# Patient Record
Sex: Male | Born: 1940 | Hispanic: Yes | Marital: Married | Smoking: Former smoker
Health system: Southern US, Community
[De-identification: ages and names within clinical notes are randomized; demographics above are authoritative.]

## PROBLEM LIST (undated history)

## (undated) DIAGNOSIS — M1990 Unspecified osteoarthritis, unspecified site: Secondary | ICD-10-CM

## (undated) DIAGNOSIS — R739 Hyperglycemia, unspecified: Secondary | ICD-10-CM

## (undated) DIAGNOSIS — C61 Malignant neoplasm of prostate: Secondary | ICD-10-CM

## (undated) DIAGNOSIS — R55 Syncope and collapse: Secondary | ICD-10-CM

## (undated) DIAGNOSIS — Z9889 Other specified postprocedural states: Secondary | ICD-10-CM

## (undated) DIAGNOSIS — E785 Hyperlipidemia, unspecified: Secondary | ICD-10-CM

## (undated) DIAGNOSIS — K219 Gastro-esophageal reflux disease without esophagitis: Secondary | ICD-10-CM

## (undated) DIAGNOSIS — T8859XA Other complications of anesthesia, initial encounter: Secondary | ICD-10-CM

## (undated) DIAGNOSIS — J189 Pneumonia, unspecified organism: Secondary | ICD-10-CM

## (undated) DIAGNOSIS — D689 Coagulation defect, unspecified: Secondary | ICD-10-CM

## (undated) DIAGNOSIS — N410 Acute prostatitis: Secondary | ICD-10-CM

## (undated) DIAGNOSIS — I10 Essential (primary) hypertension: Secondary | ICD-10-CM

## (undated) DIAGNOSIS — R112 Nausea with vomiting, unspecified: Secondary | ICD-10-CM

## (undated) DIAGNOSIS — T4145XA Adverse effect of unspecified anesthetic, initial encounter: Secondary | ICD-10-CM

## (undated) DIAGNOSIS — S129XXA Fracture of neck, unspecified, initial encounter: Secondary | ICD-10-CM

## (undated) HISTORY — DX: Essential (primary) hypertension: I10

## (undated) HISTORY — PX: LIPOMA EXCISION: SHX5283

## (undated) HISTORY — PX: EYE SURGERY: SHX253

## (undated) HISTORY — DX: Acute prostatitis: N41.0

## (undated) HISTORY — DX: Syncope and collapse: R55

## (undated) HISTORY — DX: Hyperglycemia, unspecified: R73.9

## (undated) HISTORY — DX: Hyperlipidemia, unspecified: E78.5

## (undated) HISTORY — DX: Malignant neoplasm of prostate: C61

## (undated) HISTORY — DX: Fracture of neck, unspecified, initial encounter: S12.9XXA

## (undated) HISTORY — PX: KNEE ARTHROSCOPY: SUR90

## (undated) HISTORY — DX: Unspecified osteoarthritis, unspecified site: M19.90

---

## 1977-08-02 HISTORY — PX: INGUINAL HERNIA REPAIR: SUR1180

## 1978-08-02 DIAGNOSIS — S129XXA Fracture of neck, unspecified, initial encounter: Secondary | ICD-10-CM

## 1978-08-02 HISTORY — DX: Fracture of neck, unspecified, initial encounter: S12.9XXA

## 1995-08-03 HISTORY — PX: PROSTATECTOMY: SHX69

## 2005-03-24 ENCOUNTER — Ambulatory Visit: Payer: Self-pay | Admitting: Gastroenterology

## 2005-08-18 ENCOUNTER — Ambulatory Visit: Payer: Self-pay | Admitting: Internal Medicine

## 2005-09-28 ENCOUNTER — Ambulatory Visit: Payer: Self-pay | Admitting: Psychiatry

## 2005-10-07 ENCOUNTER — Ambulatory Visit: Payer: Self-pay | Admitting: Psychiatry

## 2009-03-24 ENCOUNTER — Ambulatory Visit: Payer: Self-pay | Admitting: Internal Medicine

## 2009-04-04 ENCOUNTER — Encounter: Payer: Self-pay | Admitting: Specialist

## 2009-05-02 ENCOUNTER — Encounter: Payer: Self-pay | Admitting: Specialist

## 2009-06-09 ENCOUNTER — Ambulatory Visit: Payer: Self-pay | Admitting: Specialist

## 2009-09-30 ENCOUNTER — Ambulatory Visit: Payer: Self-pay | Admitting: Internal Medicine

## 2009-10-30 ENCOUNTER — Ambulatory Visit: Payer: Self-pay | Admitting: Internal Medicine

## 2010-03-25 ENCOUNTER — Ambulatory Visit: Payer: Self-pay | Admitting: Gastroenterology

## 2010-04-02 ENCOUNTER — Ambulatory Visit: Payer: Self-pay | Admitting: Oncology

## 2010-05-02 ENCOUNTER — Ambulatory Visit: Payer: Self-pay | Admitting: Internal Medicine

## 2010-05-08 ENCOUNTER — Ambulatory Visit: Payer: Self-pay | Admitting: Internal Medicine

## 2010-06-02 ENCOUNTER — Ambulatory Visit: Payer: Self-pay | Admitting: Internal Medicine

## 2011-03-30 ENCOUNTER — Ambulatory Visit (INDEPENDENT_AMBULATORY_CARE_PROVIDER_SITE_OTHER): Payer: Medicare Other | Admitting: Internal Medicine

## 2011-03-30 ENCOUNTER — Encounter: Payer: Self-pay | Admitting: Internal Medicine

## 2011-03-30 DIAGNOSIS — G43909 Migraine, unspecified, not intractable, without status migrainosus: Secondary | ICD-10-CM | POA: Insufficient documentation

## 2011-03-30 DIAGNOSIS — I10 Essential (primary) hypertension: Secondary | ICD-10-CM | POA: Insufficient documentation

## 2011-03-30 DIAGNOSIS — K121 Other forms of stomatitis: Secondary | ICD-10-CM

## 2011-03-30 DIAGNOSIS — R079 Chest pain, unspecified: Secondary | ICD-10-CM

## 2011-03-30 DIAGNOSIS — K1370 Unspecified lesions of oral mucosa: Secondary | ICD-10-CM

## 2011-03-30 LAB — COMPREHENSIVE METABOLIC PANEL
CO2: 33 mEq/L — ABNORMAL HIGH (ref 19–32)
Calcium: 9.3 mg/dL (ref 8.4–10.5)
Chloride: 105 mEq/L (ref 96–112)
Creatinine, Ser: 0.7 mg/dL (ref 0.4–1.5)
GFR: 116.25 mL/min (ref >60.00)
Glucose, Bld: 102 mg/dL — ABNORMAL HIGH (ref 70–99)
Total Bilirubin: 0.5 mg/dL (ref 0.3–1.2)

## 2011-03-30 NOTE — Progress Notes (Signed)
Subjective:    Patient ID: Seth Baxter, male    DOB: 07/06/1941, 70 y.o.   MRN: [id number]  HPI Seth Baxter is a 70 year old male with a history of hypertension who presents for followup. He brings a record of his blood pressure today. His typical blood pressure ranges from 120-150/70-90. He does have one outlier of 177/90. This occurred when he forgot to take his medication. He continues to be extremely active working out at [employer] by running 5 miles most days of the week. He has had some intermittent episodes of sharp chest pain. These episodes do not occur when he is exercising. They occur at rest. They're described as a sharp pain that extends through his mid chest. They last only seconds and resolved without intervention. He denies any shortness of breath, diaphoresis, nausea, palpitations, or other symptoms.  Seth Baxter also reports recurrent episodes of oral ulcers. He reports these ulcers are painful. They occur both on the back of his mouth and on his hard palate. He has not been able to identify a trigger such as spicy foods that might lead to ulcers. He denies any recent increase in stress. He has been using Ambusol for the ulcers with moderate improvement.    Outpatient Encounter Prescriptions as of 03/30/2011  Medication Sig Dispense Refill  . Eszopiclone (ESZOPICLONE) 3 MG TABS Take 3 mg by mouth at bedtime. Take immediately before bedtime       . meloxicam (MOBIC) 15 MG tablet Take 15 mg by mouth daily.        . metoprolol succinate (TOPROL-XL) 25 MG 24 hr tablet Take 25 mg by mouth daily.        . quinapril (ACCUPRIL) 10 MG tablet Take 10 mg by mouth at bedtime.        . rosuvastatin (CRESTOR) 5 MG tablet Take 5 mg by mouth daily.           Review of Systems  Constitutional: Negative for fever, chills, activity change, appetite change, fatigue and unexpected weight change.  HENT: Positive for mouth sores. Negative for congestion, rhinorrhea and neck pain.   Eyes:  Negative for visual disturbance.  Respiratory: Negative for cough, chest tightness, shortness of breath and wheezing.   Cardiovascular: Positive for chest pain. Negative for palpitations and leg swelling.  Gastrointestinal: Negative for abdominal pain, diarrhea, constipation and abdominal distention.  Genitourinary: Negative for dysuria, urgency and difficulty urinating.  Musculoskeletal: Positive for arthralgias (chronic knee pain). Negative for myalgias, joint swelling and gait problem.  Skin: Negative for color change and rash.  Hematological: Negative for adenopathy.  Psychiatric/Behavioral: Negative for sleep disturbance and dysphoric mood. The patient is not nervous/anxious.    BP 147/67  Pulse 52  Temp(Src) 98 F (36.7 C) (Oral)  Resp 12  Ht 5\' 3"  (1.6 m)  Wt 132 lb (59.875 kg)  BMI 23.38 kg/m2  SpO2 96%     Objective:   Physical Exam  Constitutional: He is oriented to person, place, and time. He appears well-developed and well-nourished. No distress.  HENT:  Head: Normocephalic and atraumatic.  Right Ear: External ear normal.  Left Ear: External ear normal.  Nose: Nose normal.  Mouth/Throat: Oropharynx is clear and moist. No oropharyngeal exudate.       Few pustular lesions and ulcers bilateral tonsillar pillars and anterior hard palate  Eyes: Conjunctivae and EOM are normal. Pupils are equal, round, and reactive to light. Right eye exhibits no discharge. Left eye exhibits no discharge. No scleral icterus.  Neck: Normal range of motion. Neck supple. No tracheal deviation present. No thyromegaly present.  Cardiovascular: Regular rhythm, S1 normal, S2 normal, normal heart sounds and normal pulses.  Bradycardia present.  PMI is not displaced.  Exam reveals no gallop and no friction rub.   No murmur heard. Pulmonary/Chest: Effort normal and breath sounds normal. No respiratory distress. He has no wheezes. He has no rales. He exhibits no tenderness, no edema, no deformity and no  swelling.  Abdominal: Soft. Bowel sounds are normal. He exhibits no distension and no mass. There is no tenderness. There is no rebound and no guarding.  Musculoskeletal: Normal range of motion. He exhibits no edema.  Lymphadenopathy:    He has no cervical adenopathy.  Neurological: He is alert and oriented to person, place, and time. No cranial nerve deficit. Coordination normal.  Skin: Skin is warm and dry. No rash noted. He is not diaphoretic. No erythema. No pallor.  Psychiatric: He has a normal mood and affect. His behavior is normal. Judgment and thought content normal.          Assessment & Plan:  1. Chest pain -Seth Baxter reports intermittent episodes of fleeting sharp chest pain over the last several weeks. Given that this pain does not occur with exertion and he is able to run 5 miles per day, my suspicion of cardiac etiology is low. We will however perform an EKG today. Pulmonary etiology seems unlikely as no cough, dyspnea or other complaints. Will however get a baseline chest x-ray. Question if this pain may be musculoskeletal in nature.  He does lift weights on a regular basis. Alternative etiologies would include gastroesophageal reflux. Should his EKG and chest x-ray be normal and lab work be normal will give a trial of PPI to see if any improvement. He will return to clinic in 4 weeks. We will call him with results of tests.  2.Hypertension - Patient with a long-standing history of hypertension, typically well-controlled. Single recent elevated blood pressure in the setting of failure to take medication. For now will continue current medications. Will check renal function with labs today.   3. 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 Appearance on exam today is not consistent with simple aphthous ulcer. Ulcerated areas on tonsillar pillars appear more pustular in nature. Given the persistence of these lesions, and history of tobacco  exposure, I think they warrant a biopsy. Will set up with ENT physician.

## 2011-03-30 NOTE — Patient Instructions (Signed)
Bring list of medications. Email with blood pressure. Return to clinic in 1 month.

## 2011-04-01 ENCOUNTER — Ambulatory Visit: Payer: Self-pay | Admitting: Internal Medicine

## 2011-04-12 ENCOUNTER — Ambulatory Visit: Payer: Self-pay | Admitting: Specialist

## 2011-04-14 ENCOUNTER — Encounter: Payer: Self-pay | Admitting: Internal Medicine

## 2011-05-03 ENCOUNTER — Ambulatory Visit (INDEPENDENT_AMBULATORY_CARE_PROVIDER_SITE_OTHER): Payer: Medicare Other | Admitting: Internal Medicine

## 2011-05-03 ENCOUNTER — Encounter: Payer: Self-pay | Admitting: Internal Medicine

## 2011-05-03 DIAGNOSIS — M25569 Pain in unspecified knee: Secondary | ICD-10-CM

## 2011-05-03 DIAGNOSIS — R079 Chest pain, unspecified: Secondary | ICD-10-CM

## 2011-05-03 DIAGNOSIS — M25561 Pain in right knee: Secondary | ICD-10-CM

## 2011-05-03 DIAGNOSIS — I10 Essential (primary) hypertension: Secondary | ICD-10-CM

## 2011-05-03 DIAGNOSIS — G43909 Migraine, unspecified, not intractable, without status migrainosus: Secondary | ICD-10-CM

## 2011-05-03 NOTE — Progress Notes (Signed)
Subjective:    Patient ID: Seth Baxter, male    DOB: 1940-10-23, 70 y.o.   MRN: [id number]  HPI Seth Baxter is a 70 year old male with a history of hypertension who presents for followup. At his last visit he complained of intermittent chest pain which was nonexertional in nature. He had an EKG and chest x-ray which were both normal. He also had lab work including electrolytes and renal function which were normal. He continues to exercise on an almost daily basis without chest pain. He notices chest pain most with movement of his arms. The pain is described as minimal and he does not take medications for it.  He has been checking his blood pressure and it typically runs 130 to 150/80-90. He notes that his blood pressure is elevated this morning and attributes this to a headache. He has chronic migraines for which he takes butalbital.  He also notes that he was recently seen by orthopedics and had an MRI of his right knee because of persistent knee pain. He reports that he was diagnosed with a meniscal tear. He has had a previous repair of a torn meniscus in the same knee. He reports that his orthopedic surgeon injected cortisone and is planning to follow him symptomatically.  Outpatient Encounter Prescriptions as of 05/03/2011  Medication Sig Dispense Refill  . acetaminophen (TYLENOL ARTHRITIS PAIN) 650 MG CR tablet Take 650 mg by mouth 3 (three) times daily.        Marland Kitchen amLODipine (NORVASC) 10 MG tablet Take 10 mg by mouth daily.        . butalbital-aspirin-caffeine (FIORINAL) 50-325-40 MG per capsule Take 1 capsule by mouth as needed.        . chlorpheniramine (CHLOR-TRIMETON) 4 MG tablet Take 4 mg by mouth as needed.        . cholecalciferol (VITAMIN D) 1000 UNITS tablet Take 1,000 Units by mouth daily.        . Eszopiclone (ESZOPICLONE) 3 MG TABS Take 3 mg by mouth at bedtime. Take immediately before bedtime       . fish oil-omega-3 fatty acids 1000 MG capsule Take 1 g by mouth 3 (three) times  daily.        . Garlic 1000 MG CAPS Take 1 capsule by mouth 2 (two) times daily.        . meloxicam (MOBIC) 15 MG tablet Take 15 mg by mouth daily.        . metoprolol succinate (TOPROL-XL) 25 MG 24 hr tablet Take 25 mg by mouth daily.        . Pantothenic Acid 500 MG TABS Take 1 tablet by mouth daily.        . quinapril (ACCUPRIL) 10 MG tablet Take 10 mg by mouth at bedtime.        . rosuvastatin (CRESTOR) 5 MG tablet Take 5 mg by mouth daily.        . traMADol (ULTRAM) 50 MG tablet Take 50 mg by mouth as needed.          Review of Systems  Constitutional: Negative for fever, chills, activity change, appetite change, fatigue and unexpected weight change.  Eyes: Negative for visual disturbance.  Respiratory: Negative for cough and shortness of breath.   Cardiovascular: Positive for chest pain. Negative for palpitations and leg swelling.  Gastrointestinal: Negative for abdominal pain and abdominal distention.  Genitourinary: Negative for dysuria, urgency and difficulty urinating.  Musculoskeletal: Positive for myalgias and arthralgias. Negative for gait problem.  Skin:  Negative for color change and rash.  Hematological: Negative for adenopathy.  Psychiatric/Behavioral: Negative for sleep disturbance and dysphoric mood. The patient is not nervous/anxious.    BP 159/79  Pulse 55  Temp(Src) 98.4 F (36.9 C) (Oral)  Resp 16  Ht 5\' 3"  (1.6 m)  Wt 133 lb 4 oz (60.442 kg)  BMI 23.60 kg/m2  SpO2 97%      Objective:   Physical Exam  Constitutional: He is oriented to person, place, and time. He appears well-developed and well-nourished. No distress.  HENT:  Head: Normocephalic and atraumatic.  Right Ear: External ear normal.  Left Ear: External ear normal.  Nose: Nose normal.  Mouth/Throat: Oropharynx is clear and moist. No oropharyngeal exudate.  Eyes: Conjunctivae and EOM are normal. Pupils are equal, round, and reactive to light. Right eye exhibits no discharge. Left eye exhibits  no discharge. No scleral icterus.  Neck: Normal range of motion. Neck supple. No tracheal deviation present. No thyromegaly present.  Cardiovascular: Normal rate, regular rhythm and normal heart sounds.  Exam reveals no gallop and no friction rub.   No murmur heard. Pulmonary/Chest: Effort normal and breath sounds normal. No respiratory distress. He has no wheezes. He has no rales. He exhibits no tenderness.  Musculoskeletal: Normal range of motion. He exhibits no edema.  Lymphadenopathy:    He has no cervical adenopathy.  Neurological: He is alert and oriented to person, place, and time. No cranial nerve deficit. Coordination normal.  Skin: Skin is warm and dry. No rash noted. He is not diaphoretic. No erythema. No pallor.  Psychiatric: He has a normal mood and affect. His behavior is normal. Judgment and thought content normal.          Assessment & Plan:  1. Hypertension - blood pressure is slightly elevated today. However, he notes headache. He will plan to monitor blood pressure at home and will e-mail me with blood pressure readings. His blood pressure persistently elevated greater than 140/90 will consider increasing his dose of quinapril. Otherwise, he will followup in 3 months.  2. Headaches - patient with chronic migraine headaches for which he takes butalbital. He reports good relief with butalbital. We'll plan to continue this medication.  3. Right Knee pain -patient with right knee meniscal tear. He reports recent steroid injection from orthopedics. We'll plan to follow symptomatically.  4. Chest pain - EKG and CXR normal.  Pain is described as mild and does not occur with exertion.  Suspect costochondritis secondary to using ellipitical.  Will continue to monitor.

## 2011-05-03 NOTE — Patient Instructions (Signed)
Email with blood pressure readings.

## 2011-05-24 ENCOUNTER — Encounter: Payer: Self-pay | Admitting: Internal Medicine

## 2011-05-28 ENCOUNTER — Other Ambulatory Visit: Payer: Self-pay | Admitting: Internal Medicine

## 2011-05-28 DIAGNOSIS — G4700 Insomnia, unspecified: Secondary | ICD-10-CM

## 2011-05-28 MED ORDER — ESZOPICLONE 3 MG PO TABS: 3.0000 mg | ORAL_TABLET | Freq: Every day | ORAL | Status: DC

## 2011-07-11 ENCOUNTER — Other Ambulatory Visit: Payer: Self-pay | Admitting: Internal Medicine

## 2011-08-04 ENCOUNTER — Ambulatory Visit: Payer: Medicare Other | Admitting: Internal Medicine

## 2011-08-07 ENCOUNTER — Encounter: Payer: Self-pay | Admitting: *Deleted

## 2011-08-07 ENCOUNTER — Encounter: Payer: Self-pay | Admitting: Internal Medicine

## 2011-08-11 ENCOUNTER — Ambulatory Visit: Payer: Medicare Other | Admitting: Internal Medicine

## 2011-08-13 ENCOUNTER — Other Ambulatory Visit: Payer: Self-pay | Admitting: Internal Medicine

## 2011-08-24 ENCOUNTER — Ambulatory Visit (INDEPENDENT_AMBULATORY_CARE_PROVIDER_SITE_OTHER): Payer: Medicare Other | Admitting: Internal Medicine

## 2011-08-24 ENCOUNTER — Encounter: Payer: Self-pay | Admitting: Internal Medicine

## 2011-08-24 VITALS — BP 122/70 | HR 52 | Temp 98.3°F | Ht 64.0 in | Wt 126.0 lb

## 2011-08-24 DIAGNOSIS — I10 Essential (primary) hypertension: Secondary | ICD-10-CM | POA: Diagnosis not present

## 2011-08-24 DIAGNOSIS — G4700 Insomnia, unspecified: Secondary | ICD-10-CM

## 2011-08-24 DIAGNOSIS — R1011 Right upper quadrant pain: Secondary | ICD-10-CM

## 2011-08-24 DIAGNOSIS — R519 Headache, unspecified: Secondary | ICD-10-CM

## 2011-08-24 DIAGNOSIS — N529 Male erectile dysfunction, unspecified: Secondary | ICD-10-CM | POA: Diagnosis not present

## 2011-08-24 DIAGNOSIS — R110 Nausea: Secondary | ICD-10-CM | POA: Insufficient documentation

## 2011-08-24 LAB — COMPREHENSIVE METABOLIC PANEL
ALT: 21 U/L (ref 0–53)
AST: 18 U/L (ref 0–37)
Albumin: 4.1 g/dL (ref 3.5–5.2)
Calcium: 9.4 mg/dL (ref 8.4–10.5)
Chloride: 103 mEq/L (ref 96–112)
Creatinine, Ser: 0.7 mg/dL (ref 0.4–1.5)
Potassium: 3.6 mEq/L (ref 3.5–5.1)
Sodium: 141 mEq/L (ref 135–145)

## 2011-08-24 LAB — CBC WITH DIFFERENTIAL/PLATELET
Basophils Relative: 0.4 % (ref 0.0–3.0)
Eosinophils Absolute: 0.2 10*3/uL (ref 0.0–0.7)
MCHC: 34.3 g/dL (ref 30.0–36.0)
MCV: 97.6 fl (ref 78.0–100.0)
Monocytes Absolute: 0.4 10*3/uL (ref 0.1–1.0)
Neutrophils Relative %: 72.4 % (ref 43.0–77.0)
RBC: 4.36 Mil/uL (ref 4.22–5.81)

## 2011-08-24 MED ORDER — ESZOPICLONE 3 MG PO TABS: 3.0000 mg | ORAL_TABLET | Freq: Every day | ORAL | Status: DC

## 2011-08-24 MED ORDER — BUTALBITAL-ASPIRIN-CAFFEINE 50-325-40 MG PO CAPS: 1.0000 | ORAL_CAPSULE | Freq: Two times a day (BID) | ORAL | Status: DC | PRN

## 2011-08-24 NOTE — Assessment & Plan Note (Signed)
Blood pressures are recently well controlled on current medications. We'll plan to continue. Patient will have renal function checked today. We'll plan to followup his blood pressure in 6 months.

## 2011-08-24 NOTE — Assessment & Plan Note (Signed)
He will try holding dose of metoprolol day prior to planned intercourse. He will use Cialis 20 mg as needed. He will call if no improvement in his symptoms.

## 2011-08-24 NOTE — Progress Notes (Signed)
Subjective:    Patient ID: Seth Baxter, male    DOB: July 08, 1941, 71 y.o.   MRN: [id number]  HPI 71 year old male with a history of hypertension presents for followup. In regards to his hypertension, he brings record of his blood pressures today which show most blood pressures between 1:30 and 140/70-80. This is an improvement compared to previous. He notes he has been following a low glycemic index diet and has been exercising on a regular basis. He reports full compliance with his medications. He denies any problems with his medications except for some difficulty maintaining erection with the use of metoprolol. He has used Cialis in the past to help with erectile dysfunction, however recently he has not been as effective. He would like to intermittently hold the dose of metoprolol to see if there is any improvement in his sexual function.  He is also concerned today about a 2 week history of intermittent nausea. He reports the nausea occurs after eating or even with smelling food. He initially had some episodes of vomiting but this has subsided. He has also intermittently had some right upper quadrant abdominal pain. He has not taken any medication for nausea. He has not had any diarrhea. He has not had any change in his bowel habits or blood in his stool.  Outpatient Encounter Prescriptions as of 08/24/2011  Medication Sig Dispense Refill  . Calcium Carbonate-Vitamin D (CALTRATE 600+D) 600-400 MG-UNIT per tablet Take 1 tablet by mouth daily.      Marland Kitchen acetaminophen (TYLENOL ARTHRITIS PAIN) 650 MG CR tablet Take 650 mg by mouth 3 (three) times daily.        Marland Kitchen amLODipine (NORVASC) 10 MG tablet TAKE 1 TABLET DAILY  90 tablet  2  . butalbital-aspirin-caffeine (FIORINAL) 50-325-40 MG per capsule Take 1 capsule by mouth 2 (two) times daily as needed.  60 capsule  3  . chlorpheniramine (CHLOR-TRIMETON) 4 MG tablet Take 4 mg by mouth as needed.        . Eszopiclone (ESZOPICLONE) 3 MG TABS Take 1 tablet (3 mg  total) by mouth at bedtime. Take immediately before bedtime  90 tablet  1  . meloxicam (MOBIC) 15 MG tablet Take 15 mg by mouth daily.        . metoprolol succinate (TOPROL-XL) 25 MG 24 hr tablet Take 25 mg by mouth daily.        . Pantothenic Acid 500 MG TABS Take 1 tablet by mouth daily.        . quinapril (ACCUPRIL) 40 MG tablet TAKE 1 TABLET DAILY  90 tablet  2  . rosuvastatin (CRESTOR) 5 MG tablet Take 5 mg by mouth daily.        . traMADol (ULTRAM) 50 MG tablet Take 50 mg by mouth as needed.          Review of Systems  Constitutional: Negative for fever, chills, activity change, appetite change, fatigue and unexpected weight change.  Eyes: Negative for visual disturbance.  Respiratory: Negative for cough and shortness of breath.   Cardiovascular: Negative for chest pain, palpitations and leg swelling.  Gastrointestinal: Positive for nausea, vomiting and abdominal pain (intermittent RUQ). Negative for diarrhea, constipation, blood in stool, abdominal distention, anal bleeding and rectal pain.  Genitourinary: Negative for dysuria, urgency and difficulty urinating.  Musculoskeletal: Negative for arthralgias and gait problem.  Skin: Negative for color change and rash.  Hematological: Negative for adenopathy.  Psychiatric/Behavioral: Negative for sleep disturbance and dysphoric mood. The patient is not nervous/anxious.  BP 122/70  Pulse 52  Temp(Src) 98.3 F (36.8 C) (Oral)  Ht 5\' 4"  (1.626 m)  Wt 126 lb (57.153 kg)  BMI 21.63 kg/m2  SpO2 98%     Objective:   Physical Exam  Constitutional: He is oriented to person, place, and time. He appears well-developed and well-nourished. No distress.  HENT:  Head: Normocephalic and atraumatic.  Right Ear: External ear normal.  Left Ear: External ear normal.  Nose: Nose normal.  Mouth/Throat: Oropharynx is clear and moist. No oropharyngeal exudate.  Eyes: Conjunctivae and EOM are normal. Pupils are equal, round, and reactive to light.  Right eye exhibits no discharge. Left eye exhibits no discharge. No scleral icterus.  Neck: Normal range of motion. Neck supple. No tracheal deviation present. No thyromegaly present.  Cardiovascular: Normal rate, regular rhythm and normal heart sounds.  Exam reveals no gallop and no friction rub.   No murmur heard. Pulmonary/Chest: Effort normal and breath sounds normal. No respiratory distress. He has no wheezes. He has no rales. He exhibits no tenderness.  Abdominal: Soft. Bowel sounds are normal. He exhibits no distension and no mass. There is no tenderness. There is no rebound and no guarding.  Musculoskeletal: Normal range of motion. He exhibits no edema.  Lymphadenopathy:    He has no cervical adenopathy.  Neurological: He is alert and oriented to person, place, and time. No cranial nerve deficit. Coordination normal.  Skin: Skin is warm and dry. No rash noted. He is not diaphoretic. No erythema. No pallor.  Psychiatric: He has a normal mood and affect. His behavior is normal. Judgment and thought content normal.          Assessment & Plan:

## 2011-08-24 NOTE — Assessment & Plan Note (Signed)
Symptoms are most concerning for cholecystitis. Will check CMP with labs today will get abdominal ultrasound for evaluation of the gallbladder and pancreas. We'll also check for H. pylori infection. Patient will followup in 2 weeks. If initial workup is unrevealing, would prefer to set him up for endoscopy.

## 2011-08-25 ENCOUNTER — Ambulatory Visit: Payer: Self-pay | Admitting: Internal Medicine

## 2011-08-25 DIAGNOSIS — R109 Unspecified abdominal pain: Secondary | ICD-10-CM | POA: Diagnosis not present

## 2011-08-25 DIAGNOSIS — R110 Nausea: Secondary | ICD-10-CM | POA: Diagnosis not present

## 2011-08-27 ENCOUNTER — Telehealth: Payer: Self-pay | Admitting: Internal Medicine

## 2011-08-27 NOTE — Telephone Encounter (Signed)
See My Chart Message.

## 2011-08-29 ENCOUNTER — Encounter: Payer: Self-pay | Admitting: Internal Medicine

## 2011-09-07 ENCOUNTER — Encounter: Payer: Self-pay | Admitting: Internal Medicine

## 2011-09-07 ENCOUNTER — Ambulatory Visit (INDEPENDENT_AMBULATORY_CARE_PROVIDER_SITE_OTHER): Payer: Medicare Other | Admitting: Internal Medicine

## 2011-09-07 DIAGNOSIS — I10 Essential (primary) hypertension: Secondary | ICD-10-CM

## 2011-09-07 DIAGNOSIS — K120 Recurrent oral aphthae: Secondary | ICD-10-CM | POA: Insufficient documentation

## 2011-09-07 DIAGNOSIS — R110 Nausea: Secondary | ICD-10-CM | POA: Diagnosis not present

## 2011-09-07 NOTE — Assessment & Plan Note (Signed)
Chronic. We'll continue to use Kenalog cream as needed. Patient has been evaluated by ENT in the past.

## 2011-09-07 NOTE — Progress Notes (Signed)
Subjective:    Patient ID: Seth Baxter, male    DOB: 22-Aug-1940, 71 y.o.   MRN: [id number]  HPI 71 year old male with a recent history of nausea and abdominal pain presents for followup. He reports that his symptoms of nausea and abdominal pain have resolved. He is able to tolerate a full diet. He denies any vomiting or diarrhea. Recent ultrasound of the abdomen was normal. Lab work including liver function test and testing for H. pylori were also normal.  His only concern today is a several day history of right-sided sore throat. He has a history of oral ulcers in the past for which she has used Kenalog. He notes several new oral ulcers he questions whether he may have an ulcer on the right side of his throat. He denies any nasal congestion, difficulty swallowing, fever, chills, cough, or other symptoms.  In regards to his hypertension, he brings records today of his recent blood pressures which have been running mostly 130/80. He has decreased his metoprolol dose to once daily.  Outpatient Encounter Prescriptions as of 09/07/2011  Medication Sig Dispense Refill  . acetaminophen (TYLENOL ARTHRITIS PAIN) 650 MG CR tablet Take 650 mg by mouth 3 (three) times daily.        Marland Kitchen amLODipine (NORVASC) 10 MG tablet TAKE 1 TABLET DAILY  90 tablet  2  . butalbital-aspirin-caffeine (FIORINAL) 50-325-40 MG per capsule Take 1 capsule by mouth 2 (two) times daily as needed.  60 capsule  3  . Calcium Carbonate-Vitamin D (CALTRATE 600+D) 600-400 MG-UNIT per tablet Take 1 tablet by mouth daily.      . chlorpheniramine (CHLOR-TRIMETON) 4 MG tablet Take 4 mg by mouth as needed.        . Eszopiclone (ESZOPICLONE) 3 MG TABS Take 1 tablet (3 mg total) by mouth at bedtime. Take immediately before bedtime  90 tablet  1  . meloxicam (MOBIC) 15 MG tablet Take 15 mg by mouth daily.        . metoprolol succinate (TOPROL-XL) 25 MG 24 hr tablet Take 25 mg by mouth daily.        . Pantothenic Acid 500 MG TABS Take 1 tablet by  mouth daily.        . quinapril (ACCUPRIL) 40 MG tablet TAKE 1 TABLET DAILY  90 tablet  2  . rosuvastatin (CRESTOR) 5 MG tablet Take 5 mg by mouth daily.        . traMADol (ULTRAM) 50 MG tablet Take 50 mg by mouth as needed.          Review of Systems  Constitutional: Negative for fever, chills, activity change, appetite change, fatigue and unexpected weight change.  HENT: Positive for sore throat. Negative for ear pain, congestion, rhinorrhea, trouble swallowing, postnasal drip and sinus pressure.   Eyes: Negative for visual disturbance.  Respiratory: Negative for cough and shortness of breath.   Cardiovascular: Negative for chest pain, palpitations and leg swelling.  Gastrointestinal: Negative for abdominal pain and abdominal distention.  Genitourinary: Negative for dysuria, urgency and difficulty urinating.  Musculoskeletal: Negative for arthralgias and gait problem.  Skin: Negative for color change and rash.  Hematological: Negative for adenopathy.  Psychiatric/Behavioral: Negative for sleep disturbance and dysphoric mood. The patient is not nervous/anxious.    BP 110/68  Pulse 62  Temp(Src) 98.3 F (36.8 C) (Oral)  Ht 5\' 3"  (1.6 m)  Wt 126 lb (57.153 kg)  BMI 22.32 kg/m2  SpO2 99%     Objective:   Physical Exam  Constitutional: He is oriented to person, place, and time. He appears well-developed and well-nourished. No distress.  HENT:  Head: Normocephalic and atraumatic.  Right Ear: External ear normal.  Left Ear: External ear normal.  Nose: Nose normal.  Mouth/Throat: Oropharynx is clear and moist. Oral lesions present. No oropharyngeal exudate, posterior oropharyngeal edema or posterior oropharyngeal erythema.    Eyes: Conjunctivae and EOM are normal. Pupils are equal, round, and reactive to light. Right eye exhibits no discharge. Left eye exhibits no discharge. No scleral icterus.  Neck: Normal range of motion. Neck supple. No tracheal deviation present. No  thyromegaly present.  Cardiovascular: Normal rate, regular rhythm and normal heart sounds.  Exam reveals no gallop and no friction rub.   No murmur heard. Pulmonary/Chest: Effort normal and breath sounds normal. No respiratory distress. He has no wheezes. He has no rales. He exhibits no tenderness.  Abdominal: Soft. Bowel sounds are normal. He exhibits no distension and no mass. There is no tenderness. There is no rebound and no guarding.  Musculoskeletal: Normal range of motion. He exhibits no edema.  Lymphadenopathy:    He has no cervical adenopathy.  Neurological: He is alert and oriented to person, place, and time. No cranial nerve deficit. Coordination normal.  Skin: Skin is warm and dry. No rash noted. He is not diaphoretic. No erythema. No pallor.  Psychiatric: He has a normal mood and affect. His behavior is normal. Judgment and thought content normal.          Assessment & Plan:

## 2011-09-07 NOTE — Assessment & Plan Note (Signed)
Symptoms resolved. Lab work including liver function test and testing for H. Pylori as well as abdominal ultrasound were normal. We'll continue to monitor. Followup as needed.

## 2011-09-07 NOTE — Assessment & Plan Note (Signed)
Blood pressure very well-controlled today, even on reduce dose of metoprolol may once daily. We'll continue to monitor. Followup in 6 months.

## 2011-10-05 ENCOUNTER — Encounter: Payer: Self-pay | Admitting: Internal Medicine

## 2011-10-11 DIAGNOSIS — M545 Low back pain: Secondary | ICD-10-CM | POA: Diagnosis not present

## 2011-10-11 DIAGNOSIS — M1710 Unilateral primary osteoarthritis, unspecified knee: Secondary | ICD-10-CM | POA: Diagnosis not present

## 2011-10-11 DIAGNOSIS — IMO0002 Reserved for concepts with insufficient information to code with codable children: Secondary | ICD-10-CM | POA: Diagnosis not present

## 2011-10-19 ENCOUNTER — Other Ambulatory Visit: Payer: Self-pay | Admitting: Internal Medicine

## 2011-10-27 DIAGNOSIS — M545 Low back pain: Secondary | ICD-10-CM | POA: Diagnosis not present

## 2011-10-27 DIAGNOSIS — M67919 Unspecified disorder of synovium and tendon, unspecified shoulder: Secondary | ICD-10-CM | POA: Diagnosis not present

## 2011-10-27 DIAGNOSIS — IMO0002 Reserved for concepts with insufficient information to code with codable children: Secondary | ICD-10-CM | POA: Diagnosis not present

## 2011-11-29 ENCOUNTER — Encounter: Payer: Self-pay | Admitting: Internal Medicine

## 2011-12-02 ENCOUNTER — Encounter: Payer: Self-pay | Admitting: Internal Medicine

## 2011-12-23 ENCOUNTER — Encounter: Payer: Self-pay | Admitting: Internal Medicine

## 2011-12-24 DIAGNOSIS — S93409A Sprain of unspecified ligament of unspecified ankle, initial encounter: Secondary | ICD-10-CM | POA: Diagnosis not present

## 2012-01-26 ENCOUNTER — Encounter: Payer: Self-pay | Admitting: Internal Medicine

## 2012-02-16 ENCOUNTER — Ambulatory Visit: Payer: Self-pay | Admitting: Pain Medicine

## 2012-02-16 DIAGNOSIS — M545 Low back pain, unspecified: Secondary | ICD-10-CM | POA: Diagnosis not present

## 2012-02-16 DIAGNOSIS — G540 Brachial plexus disorders: Secondary | ICD-10-CM | POA: Diagnosis not present

## 2012-02-16 DIAGNOSIS — M5430 Sciatica, unspecified side: Secondary | ICD-10-CM | POA: Diagnosis not present

## 2012-02-16 DIAGNOSIS — F17200 Nicotine dependence, unspecified, uncomplicated: Secondary | ICD-10-CM | POA: Diagnosis not present

## 2012-02-16 DIAGNOSIS — Z9079 Acquired absence of other genital organ(s): Secondary | ICD-10-CM | POA: Diagnosis not present

## 2012-02-16 DIAGNOSIS — M069 Rheumatoid arthritis, unspecified: Secondary | ICD-10-CM | POA: Diagnosis not present

## 2012-02-16 DIAGNOSIS — I10 Essential (primary) hypertension: Secondary | ICD-10-CM | POA: Diagnosis not present

## 2012-02-16 DIAGNOSIS — M47817 Spondylosis without myelopathy or radiculopathy, lumbosacral region: Secondary | ICD-10-CM | POA: Diagnosis not present

## 2012-02-16 DIAGNOSIS — Z79899 Other long term (current) drug therapy: Secondary | ICD-10-CM | POA: Diagnosis not present

## 2012-02-16 DIAGNOSIS — K219 Gastro-esophageal reflux disease without esophagitis: Secondary | ICD-10-CM | POA: Diagnosis not present

## 2012-02-16 DIAGNOSIS — G8929 Other chronic pain: Secondary | ICD-10-CM | POA: Diagnosis not present

## 2012-02-16 DIAGNOSIS — Z96659 Presence of unspecified artificial knee joint: Secondary | ICD-10-CM | POA: Diagnosis not present

## 2012-02-16 DIAGNOSIS — Z7982 Long term (current) use of aspirin: Secondary | ICD-10-CM | POA: Diagnosis not present

## 2012-02-16 DIAGNOSIS — M25569 Pain in unspecified knee: Secondary | ICD-10-CM | POA: Diagnosis not present

## 2012-02-16 DIAGNOSIS — R32 Unspecified urinary incontinence: Secondary | ICD-10-CM | POA: Diagnosis not present

## 2012-02-16 DIAGNOSIS — M79609 Pain in unspecified limb: Secondary | ICD-10-CM | POA: Diagnosis not present

## 2012-02-16 DIAGNOSIS — Z8546 Personal history of malignant neoplasm of prostate: Secondary | ICD-10-CM | POA: Diagnosis not present

## 2012-02-17 ENCOUNTER — Encounter: Payer: Self-pay | Admitting: Internal Medicine

## 2012-02-21 ENCOUNTER — Other Ambulatory Visit: Payer: Self-pay | Admitting: *Deleted

## 2012-02-21 ENCOUNTER — Other Ambulatory Visit: Payer: Self-pay | Admitting: Internal Medicine

## 2012-02-21 MED ORDER — ESZOPICLONE 3 MG PO TABS: 3.0000 mg | ORAL_TABLET | Freq: Every day | ORAL | Status: DC

## 2012-02-21 NOTE — Telephone Encounter (Signed)
Rx called to [facility]. 

## 2012-02-24 ENCOUNTER — Other Ambulatory Visit: Payer: Self-pay | Admitting: Internal Medicine

## 2012-03-06 ENCOUNTER — Ambulatory Visit (INDEPENDENT_AMBULATORY_CARE_PROVIDER_SITE_OTHER): Payer: Medicare Other | Admitting: Internal Medicine

## 2012-03-06 ENCOUNTER — Encounter: Payer: Self-pay | Admitting: Internal Medicine

## 2012-03-06 VITALS — BP 132/80 | HR 56 | Temp 97.9°F | Ht 63.0 in | Wt 127.2 lb

## 2012-03-06 DIAGNOSIS — I10 Essential (primary) hypertension: Secondary | ICD-10-CM | POA: Diagnosis not present

## 2012-03-06 DIAGNOSIS — R252 Cramp and spasm: Secondary | ICD-10-CM | POA: Diagnosis not present

## 2012-03-06 DIAGNOSIS — B009 Herpesviral infection, unspecified: Secondary | ICD-10-CM | POA: Diagnosis not present

## 2012-03-06 DIAGNOSIS — R51 Headache: Secondary | ICD-10-CM

## 2012-03-06 DIAGNOSIS — G4700 Insomnia, unspecified: Secondary | ICD-10-CM | POA: Diagnosis not present

## 2012-03-06 DIAGNOSIS — R519 Headache, unspecified: Secondary | ICD-10-CM | POA: Insufficient documentation

## 2012-03-06 LAB — COMPREHENSIVE METABOLIC PANEL
AST: 22 U/L (ref 0–37)
Albumin: 4.1 g/dL (ref 3.5–5.2)
Alkaline Phosphatase: 87 U/L (ref 39–117)
BUN: 24 mg/dL — ABNORMAL HIGH (ref 6–23)
Creatinine, Ser: 0.6 mg/dL (ref 0.4–1.5)
Potassium: 3.8 mEq/L (ref 3.5–5.1)

## 2012-03-06 LAB — LIPID PANEL
LDL Cholesterol: 70 mg/dL (ref 0–99)
Total CHOL/HDL Ratio: 3
VLDL: 25 mg/dL (ref 0.0–40.0)

## 2012-03-06 MED ORDER — VALACYCLOVIR HCL 1 G PO TABS: 1000.0000 mg | ORAL_TABLET | Freq: Two times a day (BID) | ORAL | Status: AC

## 2012-03-06 MED ORDER — BUTALBITAL-ASPIRIN-CAFFEINE 50-325-40 MG PO CAPS: 1.0000 | ORAL_CAPSULE | Freq: Two times a day (BID) | ORAL | Status: DC | PRN

## 2012-03-06 MED ORDER — ESZOPICLONE 3 MG PO TABS: 3.0000 mg | ORAL_TABLET | Freq: Every day | ORAL | Status: DC

## 2012-03-06 NOTE — Assessment & Plan Note (Signed)
Blood pressures generally well controlled. Will continue current medications. Followup 6 months.

## 2012-03-06 NOTE — Assessment & Plan Note (Signed)
Symptoms well controlled with Lunesta. We'll continue.

## 2012-03-06 NOTE — Assessment & Plan Note (Signed)
Right lower extremity muscle cramping. Occurs at rest in the evenings. Not consistent with claudication. Will check electrolytes, thyroid function with labs today.

## 2012-03-06 NOTE — Progress Notes (Signed)
Subjective:    Patient ID: Seth Baxter, male    DOB: [birth year], 71 y.o.   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 He reports he is generally been feeling well. He continues to exercise on the elliptical on a daily basis. He has some ongoing issues with right hip pain for which he is followed by orthopedics. He has opted not to pursue steroid injections at this point. He currently uses meloxicam and Tylenol as needed for pain with improvement.  He brings record of blood pressures today which show most blood pressures between 120 and 140/60-80. He reports full compliance with his medications.  He also has a chronic history of insomnia which is well controlled at present with the use of Lunesta.  He also has chronic headaches for which he occasionally uses butalbital. He denies any change in headache frequency or intensity.  He notes that he was recently treated by his dentist for oral herpetic lesions. He reports significant improvement with use of Valtrex.  Over the last few weeks, he has noticed some increased frequency of cramping in his right calf mostly at night. This sometimes wakes him from sleep. It does not occur when he is exercising. Cramping resolves with stretching and relaxation.  Outpatient Encounter Prescriptions as of 03/06/2012  Medication Sig Dispense Refill  . acetaminophen (TYLENOL ARTHRITIS PAIN) 650 MG CR tablet Take 650 mg by mouth 3 (three) times daily.        Marland Kitchen amLODipine (NORVASC) 10 MG tablet TAKE 1 TABLET DAILY  90 tablet  2  . butalbital-aspirin-caffeine (FIORINAL) 50-325-40 MG per capsule Take 1 capsule by mouth 2 (two) times daily as needed.  60 capsule  3  . Calcium Carbonate-Vitamin D (CALTRATE 600+D) 600-400 MG-UNIT per tablet Take 1 tablet by mouth daily.      . chlorpheniramine (CHLOR-TRIMETON) 4 MG tablet Take 4 mg by mouth as needed.          . CRESTOR 5 MG tablet TAKE 1 TABLET DAILY  90 tablet  3  . Eszopiclone (ESZOPICLONE) 3 MG TABS Take 1 tablet (3 mg total) by mouth at bedtime. Take immediately before bedtime  30 tablet  4  . ESZOPICLONE 3 MG tablet TAKE 1 TABLET EVERY NIGHT AT BEDTIME  30 tablet  0  . meloxicam (MOBIC) 15 MG tablet Take 15 mg by mouth daily.        . Pantothenic Acid 500 MG TABS Take 1 tablet by mouth daily.        . quinapril (ACCUPRIL) 40 MG tablet TAKE 1 TABLET DAILY  90 tablet  2  . TOPROL XL 25 MG 24 hr tablet TAKE 1 TABLET TWICE DAILY  180 tablet  0  . traMADol (ULTRAM) 50 MG tablet Take 50 mg by mouth as needed.        Marland Kitchen DISCONTD: butalbital-aspirin-caffeine (FIORINAL) 50-325-40 MG per capsule Take 1 capsule by mouth 2 (two) times daily as needed.  60 capsule  3  . DISCONTD: Eszopiclone (ESZOPICLONE) 3 MG TABS Take 1 tablet (3 mg total) by mouth at bedtime. Take immediately before bedtime  30 tablet  4  . valACYclovir (VALTREX) 1000 MG tablet Take 1 tablet (1,000 mg total) by mouth 2 (two) times daily.  20 tablet  6   BP 132/80  Pulse 56  Temp 97.9 F (36.6 C) (Oral)  Ht 5\' 3"  (1.6 m)  Wt 127 lb  4 oz (57.72 kg)  BMI 22.54 kg/m2  SpO2 97%  Review of Systems  Constitutional: Negative for fever, chills, activity change, appetite change, fatigue and unexpected weight change.  HENT: Positive for mouth sores.   Eyes: Negative for visual disturbance.  Respiratory: Negative for cough and shortness of breath.   Cardiovascular: Negative for chest pain, palpitations and leg swelling.  Gastrointestinal: Negative for abdominal pain and abdominal distention.  Genitourinary: Negative for dysuria, urgency and difficulty urinating.  Musculoskeletal: Positive for myalgias and arthralgias. Negative for gait problem.  Skin: Negative for color change and rash.  Neurological: Positive for headaches.  Hematological: Negative for adenopathy.  Psychiatric/Behavioral: Negative for disturbed wake/sleep cycle and  dysphoric mood. The patient is not nervous/anxious.        Objective:   Physical Exam  Constitutional: He is oriented to person, place, and time. He appears well-developed and well-nourished. No distress.  HENT:  Head: Normocephalic and atraumatic.  Right Ear: External ear normal.  Left Ear: External ear normal.  Nose: Nose normal.  Mouth/Throat: Oropharynx is clear and moist. No oropharyngeal exudate.  Eyes: Conjunctivae and EOM are normal. Pupils are equal, round, and reactive to light. Right eye exhibits no discharge. Left eye exhibits no discharge. No scleral icterus.  Neck: Normal range of motion. Neck supple. No tracheal deviation present. No thyromegaly present.  Cardiovascular: Normal rate, regular rhythm and normal heart sounds.  Exam reveals no gallop and no friction rub.   No murmur heard. Pulmonary/Chest: Effort normal and breath sounds normal. No respiratory distress. He has no wheezes. He has no rales. He exhibits no tenderness.  Abdominal: Soft. Bowel sounds are normal. He exhibits no distension and no mass. There is no tenderness. There is no guarding.  Musculoskeletal: Normal range of motion. He exhibits no edema.  Lymphadenopathy:    He has no cervical adenopathy.  Neurological: He is alert and oriented to person, place, and time. No cranial nerve deficit. Coordination normal.  Skin: Skin is warm and dry. No rash noted. He is not diaphoretic. No erythema. No pallor.  Psychiatric: He has a normal mood and affect. His behavior is normal. Judgment and thought content normal.          Assessment & Plan:

## 2012-03-06 NOTE — Assessment & Plan Note (Signed)
Oral ulcers consistent with herpetic lesions. Symptoms improved with Valtrex. Will continue as needed.

## 2012-03-06 NOTE — Assessment & Plan Note (Signed)
Symptoms well controlled with use of Tylenol and butalbital. Will continue. Follow up 6 months or sooner as needed.

## 2012-03-29 ENCOUNTER — Other Ambulatory Visit: Payer: Self-pay | Admitting: Internal Medicine

## 2012-04-10 ENCOUNTER — Encounter: Payer: Self-pay | Admitting: Internal Medicine

## 2012-04-10 ENCOUNTER — Other Ambulatory Visit: Payer: Self-pay | Admitting: *Deleted

## 2012-04-10 MED ORDER — VARDENAFIL HCL 20 MG PO TABS: 20.0000 mg | ORAL_TABLET | Freq: Every day | ORAL | Status: DC | PRN

## 2012-06-05 ENCOUNTER — Ambulatory Visit: Payer: Medicare Other | Admitting: Internal Medicine

## 2012-06-21 ENCOUNTER — Other Ambulatory Visit: Payer: Self-pay | Admitting: Internal Medicine

## 2012-06-21 NOTE — Telephone Encounter (Signed)
Med filled.  

## 2012-06-26 DIAGNOSIS — R3915 Urgency of urination: Secondary | ICD-10-CM | POA: Diagnosis not present

## 2012-06-26 DIAGNOSIS — Z8546 Personal history of malignant neoplasm of prostate: Secondary | ICD-10-CM | POA: Diagnosis not present

## 2012-06-26 DIAGNOSIS — R350 Frequency of micturition: Secondary | ICD-10-CM | POA: Diagnosis not present

## 2012-07-10 DIAGNOSIS — I10 Essential (primary) hypertension: Secondary | ICD-10-CM | POA: Diagnosis not present

## 2012-07-10 DIAGNOSIS — R0982 Postnasal drip: Secondary | ICD-10-CM | POA: Diagnosis not present

## 2012-07-13 ENCOUNTER — Ambulatory Visit (INDEPENDENT_AMBULATORY_CARE_PROVIDER_SITE_OTHER): Payer: Medicare Other | Admitting: Internal Medicine

## 2012-07-13 ENCOUNTER — Encounter: Payer: Self-pay | Admitting: Internal Medicine

## 2012-07-13 VITALS — BP 128/84 | HR 59 | Temp 98.3°F | Resp 16 | Ht 63.0 in | Wt 126.5 lb

## 2012-07-13 DIAGNOSIS — Z1331 Encounter for screening for depression: Secondary | ICD-10-CM | POA: Diagnosis not present

## 2012-07-13 DIAGNOSIS — H9190 Unspecified hearing loss, unspecified ear: Secondary | ICD-10-CM

## 2012-07-13 DIAGNOSIS — D649 Anemia, unspecified: Secondary | ICD-10-CM | POA: Diagnosis not present

## 2012-07-13 DIAGNOSIS — Z0000 Encounter for general adult medical examination without abnormal findings: Secondary | ICD-10-CM | POA: Diagnosis not present

## 2012-07-13 DIAGNOSIS — H9191 Unspecified hearing loss, right ear: Secondary | ICD-10-CM | POA: Insufficient documentation

## 2012-07-13 DIAGNOSIS — J40 Bronchitis, not specified as acute or chronic: Secondary | ICD-10-CM

## 2012-07-13 DIAGNOSIS — E785 Hyperlipidemia, unspecified: Secondary | ICD-10-CM

## 2012-07-13 MED ORDER — HYDROCOD POLST-CHLORPHEN POLST 10-8 MG/5ML PO LQCR: 5.0000 mL | Freq: Two times a day (BID) | ORAL | Status: DC | PRN

## 2012-07-13 MED ORDER — AZITHROMYCIN 250 MG PO TABS: ORAL_TABLET | ORAL | Status: DC

## 2012-07-13 NOTE — Progress Notes (Signed)
Subjective:    Patient ID: Seth Baxter, male    DOB: 12-Jan-1941, 71 y.o.   MRN: [id number]  HPI 71 year old male presents for acute visit for followup after recent evaluation at urgent care. He reports that he was seen at urgent care on Monday after having 3 days of sinus congestion, cough, general malaise, fatigue. He was diagnosed as having a viral infection and treated with Tessalon. He reports no improvement in his cough on this medication. He reports that episodes of coughing are fairly violent. He sometimes has posttussive emesis. He denies chest pain or shortness of breath. He has not recently had fever or chills. He has also started taking Robitussin-DM with some improvement. He denies any sick contacts.   He is also concerned about some hearing loss in his right ear. He notes that when he wears earphones at the gym he can hear better out of his left ear. This is been ongoing for a couple of weeks. He has never had hearing testing.  Outpatient Encounter Prescriptions as of 07/13/2012  Medication Sig Dispense Refill  . acetaminophen (TYLENOL ARTHRITIS PAIN) 650 MG CR tablet Take 650 mg by mouth 3 (three) times daily.        Marland Kitchen amLODipine (NORVASC) 10 MG tablet TAKE 1 TABLET DAILY  90 tablet  3  . butalbital-aspirin-caffeine (FIORINAL) 50-325-40 MG per capsule Take 1 capsule by mouth 2 (two) times daily as needed.  60 capsule  3  . Calcium Carbonate-Vitamin D (CALTRATE 600+D) 600-400 MG-UNIT per tablet Take 1 tablet by mouth daily.      . chlorpheniramine (CHLOR-TRIMETON) 4 MG tablet Take 4 mg by mouth as needed.        . CRESTOR 5 MG tablet TAKE 1 TABLET DAILY  90 tablet  3  . Eszopiclone (ESZOPICLONE) 3 MG TABS Take 1 tablet (3 mg total) by mouth at bedtime. Take immediately before bedtime  30 tablet  4  . meloxicam (MOBIC) 15 MG tablet Take 15 mg by mouth daily.        . metoprolol succinate (TOPROL-XL) 25 MG 24 hr tablet TAKE 1 TABLET TWICE DAILY  180 tablet  3  . Pantothenic Acid 500  MG TABS Take 1 tablet by mouth daily.        . quinapril (ACCUPRIL) 40 MG tablet TAKE 1 TABLET DAILY  90 tablet  3  . traMADol (ULTRAM) 50 MG tablet Take 50 mg by mouth as needed.        . valACYclovir (VALTREX) 1000 MG tablet Take 1 tablet (1,000 mg total) by mouth 2 (two) times daily.  20 tablet  6  . vardenafil (LEVITRA) 20 MG tablet Take 1 tablet (20 mg total) by mouth daily as needed for erectile dysfunction.  6 tablet  5  . [DISCONTINUED] ESZOPICLONE 3 MG tablet TAKE 1 TABLET EVERY NIGHT AT BEDTIME  30 tablet  0  . azithromycin (ZITHROMAX Z-PAK) 250 MG tablet Take 2 pills day 1, then 1 pill daily days 2-5  6 each  0  . chlorpheniramine-HYDROcodone (TUSSIONEX) 10-8 MG/5ML LQCR Take 5 mLs by mouth every 12 (twelve) hours as needed.  140 mL  0   BP 128/84  Pulse 59  Temp 98.3 F (36.8 C) (Oral)  Resp 16  Ht 5\' 3"  (1.6 m)  Wt 126 lb 8 oz (57.38 kg)  BMI 22.41 kg/m2  SpO2 98%  Review of Systems  Constitutional: Positive for fatigue. Negative for fever, chills and activity change.  HENT: Positive for  congestion, rhinorrhea, postnasal drip and sinus pressure. Negative for hearing loss, ear pain, nosebleeds, sore throat, sneezing, trouble swallowing, neck pain, neck stiffness, voice change, tinnitus and ear discharge.   Eyes: Negative for discharge, redness, itching and visual disturbance.  Respiratory: Positive for cough. Negative for chest tightness, shortness of breath, wheezing and stridor.   Cardiovascular: Negative for chest pain and leg swelling.  Musculoskeletal: Negative for myalgias and arthralgias.  Skin: Negative for color change and rash.  Neurological: Negative for dizziness, facial asymmetry and headaches.  Psychiatric/Behavioral: Negative for sleep disturbance.       Objective:   Physical Exam  Constitutional: He is oriented to person, place, and time. He appears well-developed and well-nourished. No distress.  HENT:  Head: Normocephalic and atraumatic.  Right Ear:  Tympanic membrane and external ear normal.  Left Ear: Tympanic membrane and external ear normal.  Nose: Nose normal.  Mouth/Throat: Oropharynx is clear and moist. No oropharyngeal exudate.  Eyes: Conjunctivae normal and EOM are normal. Pupils are equal, round, and reactive to light. Right eye exhibits no discharge. Left eye exhibits no discharge. No scleral icterus.  Neck: Normal range of motion. Neck supple. No tracheal deviation present. No thyromegaly present.  Cardiovascular: Normal rate, regular rhythm and normal heart sounds.  Exam reveals no gallop and no friction rub.   No murmur heard. Pulmonary/Chest: Effort normal. No accessory muscle usage. Not tachypneic. No respiratory distress. He has no decreased breath sounds. He has no wheezes. He has rhonchi in the right lower field. He has no rales. He exhibits no tenderness.  Musculoskeletal: Normal range of motion. He exhibits no edema.  Lymphadenopathy:    He has no cervical adenopathy.  Neurological: He is alert and oriented to person, place, and time. No cranial nerve deficit. Coordination normal.  Skin: Skin is warm and dry. No rash noted. He is not diaphoretic. No erythema. No pallor.  Psychiatric: He has a normal mood and affect. His behavior is normal. Judgment and thought content normal.          Assessment & Plan:

## 2012-07-13 NOTE — Assessment & Plan Note (Addendum)
Symptoms and exam are most consistent with bronchitis. Question if he may have pertussis paroxysms of cough. Will treat with azithromycin andTussionex for cough. Patient will call if symptoms are not improving over the next 72 hours. If no improvement, would favor getting chest x-ray.

## 2012-07-13 NOTE — Assessment & Plan Note (Signed)
Patient notes some hearing loss in his right ear. Suspect this may be related to middle ear effusion associated with his upper respiratory infection. However, if symptoms are not improved, would favor referral to cardiology for hearing testing.

## 2012-07-20 ENCOUNTER — Encounter: Payer: Self-pay | Admitting: Internal Medicine

## 2012-07-20 MED ORDER — AMOXICILLIN-POT CLAVULANATE 875-125 MG PO TABS: 1.0000 | ORAL_TABLET | Freq: Two times a day (BID) | ORAL | Status: DC

## 2012-08-08 DIAGNOSIS — J3489 Other specified disorders of nose and nasal sinuses: Secondary | ICD-10-CM | POA: Diagnosis not present

## 2012-08-14 ENCOUNTER — Other Ambulatory Visit: Payer: Self-pay | Admitting: Internal Medicine

## 2012-08-14 NOTE — Telephone Encounter (Signed)
Rx faxed to the pharmacy at [phone].

## 2012-08-24 ENCOUNTER — Encounter: Payer: Self-pay | Admitting: Internal Medicine

## 2012-09-05 ENCOUNTER — Other Ambulatory Visit (INDEPENDENT_AMBULATORY_CARE_PROVIDER_SITE_OTHER): Payer: Medicare Other

## 2012-09-05 ENCOUNTER — Ambulatory Visit: Payer: Medicare Other

## 2012-09-05 DIAGNOSIS — D649 Anemia, unspecified: Secondary | ICD-10-CM

## 2012-09-05 DIAGNOSIS — E039 Hypothyroidism, unspecified: Secondary | ICD-10-CM | POA: Diagnosis not present

## 2012-09-05 DIAGNOSIS — I10 Essential (primary) hypertension: Secondary | ICD-10-CM

## 2012-09-05 DIAGNOSIS — Z0000 Encounter for general adult medical examination without abnormal findings: Secondary | ICD-10-CM

## 2012-09-05 DIAGNOSIS — E785 Hyperlipidemia, unspecified: Secondary | ICD-10-CM

## 2012-09-05 DIAGNOSIS — R7309 Other abnormal glucose: Secondary | ICD-10-CM

## 2012-09-05 LAB — COMPREHENSIVE METABOLIC PANEL
BUN: 19 mg/dL (ref 6–23)
CO2: 31 mEq/L (ref 19–32)
Calcium: 8.8 mg/dL (ref 8.4–10.5)
Chloride: 104 mEq/L (ref 96–112)
Creatinine, Ser: 0.7 mg/dL (ref 0.4–1.5)
GFR: 115.77 mL/min (ref >60.00)

## 2012-09-05 LAB — MICROALBUMIN / CREATININE URINE RATIO: Microalb, Ur: 0.8 mg/dL (ref 0.0–1.9)

## 2012-09-05 LAB — CBC WITH DIFFERENTIAL/PLATELET
Basophils Absolute: 0 10*3/uL (ref 0.0–0.1)
Basophils Relative: 0.8 % (ref 0.0–3.0)
Eosinophils Absolute: 0.2 10*3/uL (ref 0.0–0.7)
Eosinophils Relative: 3.2 % (ref 0.0–5.0)
HCT: 42.7 % (ref 39.0–52.0)
Hemoglobin: 14.3 g/dL (ref 13.0–17.0)
Lymphocytes Relative: 23.1 % (ref 12.0–46.0)
Lymphs Abs: 1.3 10*3/uL (ref 0.7–4.0)
MCHC: 33.5 g/dL (ref 30.0–36.0)
MCV: 98.2 fl (ref 78.0–100.0)
Monocytes Absolute: 0.4 10*3/uL (ref 0.1–1.0)
Monocytes Relative: 6.7 % (ref 3.0–12.0)
Neutro Abs: 3.6 10*3/uL (ref 1.4–7.7)
Neutrophils Relative %: 66.2 % (ref 43.0–77.0)
Platelets: 195 10*3/uL (ref 150.0–400.0)
RBC: 4.35 Mil/uL (ref 4.22–5.81)
RDW: 13.4 % (ref 11.5–14.6)
WBC: 5.5 10*3/uL (ref 4.5–10.5)

## 2012-09-05 LAB — LIPID PANEL
HDL: 50.1 mg/dL (ref >39.00)
VLDL: 13.2 mg/dL (ref 0.0–40.0)

## 2012-09-05 LAB — HEMOGLOBIN A1C: Hgb A1c MFr Bld: 6.3 % (ref 4.6–6.5)

## 2012-09-13 ENCOUNTER — Ambulatory Visit (INDEPENDENT_AMBULATORY_CARE_PROVIDER_SITE_OTHER): Payer: Medicare Other | Admitting: Internal Medicine

## 2012-09-13 ENCOUNTER — Encounter: Payer: Self-pay | Admitting: Internal Medicine

## 2012-09-13 VITALS — BP 134/80 | HR 58 | Temp 98.2°F | Ht 62.5 in | Wt 127.2 lb

## 2012-09-13 DIAGNOSIS — I10 Essential (primary) hypertension: Secondary | ICD-10-CM | POA: Diagnosis not present

## 2012-09-13 DIAGNOSIS — R739 Hyperglycemia, unspecified: Secondary | ICD-10-CM | POA: Insufficient documentation

## 2012-09-13 DIAGNOSIS — Z0000 Encounter for general adult medical examination without abnormal findings: Secondary | ICD-10-CM | POA: Diagnosis not present

## 2012-09-13 DIAGNOSIS — R51 Headache: Secondary | ICD-10-CM

## 2012-09-13 DIAGNOSIS — E1165 Type 2 diabetes mellitus with hyperglycemia: Secondary | ICD-10-CM | POA: Insufficient documentation

## 2012-09-13 MED ORDER — BUTALBITAL-ASPIRIN-CAFFEINE 50-325-40 MG PO CAPS: 1.0000 | ORAL_CAPSULE | Freq: Two times a day (BID) | ORAL | Status: DC | PRN

## 2012-09-13 MED ORDER — ONETOUCH ULTRA SYSTEM W/DEVICE KIT: 1.0000 | PACK | Freq: Once | Status: DC

## 2012-09-13 MED ORDER — GLUCOSE BLOOD VI STRP: ORAL_STRIP | Status: DC

## 2012-09-13 NOTE — Assessment & Plan Note (Signed)
Lab Results  Component Value Date   HGBA1C 6.3 09/05/2012   Borderline DM based on recent labs. Pt has controlled BG well over the years with diet and exercise. Will continue. Follow up with A1c in 3 months. We discussed potentially starting metformin if BG increased.

## 2012-09-13 NOTE — Progress Notes (Signed)
Subjective:    Patient ID: Seth Baxter, male    DOB: 1941/03/26, 72 y.o.   MRN: [id number]  HPI The patient is here for annual Medicare wellness examination and management of other chronic and acute problems.   The risk factors are reflected in the social history.  The roster of all physicians providing medical care to patient - is listed in the Snapshot section of the chart.  Activities of daily living:  The patient is 100% independent in all ADLs: dressing, toileting, feeding as well as independent mobility  Home safety : The patient has smoke detectors in the home. They wear seatbelts.  There are no firearms at home. There is no violence in the home.   There is no risks for hepatitis, STDs or HIV. There is a history of blood transfusion after surgery. They is a travel history to infectious disease endemic areas of the world, [place] in 42s.  The patient has seen their dentist in the last six month. (Dr. Vonita Moss) They have seen their eye doctor in the last year. (Dr. Anson [place]) No issues with hearing  They have deferred audiologic testing in the last year.   They do not  have excessive sun exposure. Discussed the need for sun protection: hats, long sleeves and use of sunscreen if there is significant sun exposure.  (Dermatologist - Dr. Roseanne Kaufman)  Diet: the importance of a healthy diet is discussed. They do have a healthy diet.  The benefits of regular aerobic exercise were discussed. He exercises daily 6 miles on elliptical and weight training.  Depression screen: there are no signs or vegative symptoms of depression- irritability, change in appetite, anhedonia, sadness/tearfullness.  Cognitive assessment: the patient manages all their financial and personal affairs and is actively engaged. They could relate day,date,year and events.  HCPOA - set up, wife, Jill Side then her brother Conni Slipper  The following portions of the patient's history were reviewed and updated as  appropriate: allergies, current medications, past family history, past medical history,  past surgical history, past social history  and problem list.  Visual acuity was not assessed per patient preference since he has regular follow up with her ophthalmologist. Hearing and body mass index were assessed and reviewed.   During the course of the visit the patient was educated and counseled about appropriate screening and preventive services including : fall prevention , diabetes screening, nutrition counseling, colorectal cancer screening, and recommended immunizations.    Outpatient Encounter Prescriptions as of 09/13/2012  Medication Sig Dispense Refill  . acetaminophen (TYLENOL ARTHRITIS PAIN) 650 MG CR tablet Take 650 mg by mouth 3 (three) times daily.        Marland Kitchen amLODipine (NORVASC) 10 MG tablet TAKE 1 TABLET DAILY  90 tablet  3  . butalbital-aspirin-caffeine (FIORINAL) 50-325-40 MG per capsule Take 1 capsule by mouth 2 (two) times daily as needed.  60 capsule  3  . chlorpheniramine (CHLOR-TRIMETON) 4 MG tablet Take 4 mg by mouth as needed.        . CRESTOR 5 MG tablet TAKE 1 TABLET DAILY  90 tablet  3  . ESZOPICLONE 3 MG tablet TAKE 1 TABLET EVERY NIGHT AT BEDTIME  30 tablet  3  . meloxicam (MOBIC) 15 MG tablet Take 15 mg by mouth daily.        . metoprolol succinate (TOPROL-XL) 25 MG 24 hr tablet TAKE 1 TABLET TWICE DAILY  180 tablet  3  . quinapril (ACCUPRIL) 40 MG tablet TAKE 1 TABLET DAILY  90  tablet  3  . traMADol (ULTRAM) 50 MG tablet Take 50 mg by mouth as needed.        . vardenafil (LEVITRA) 20 MG tablet Take 1 tablet (20 mg total) by mouth daily as needed for erectile dysfunction.  6 tablet  5  . [DISCONTINUED] butalbital-aspirin-caffeine (FIORINAL) 50-325-40 MG per capsule Take 1 capsule by mouth 2 (two) times daily as needed.  60 capsule  3  . Blood Glucose Monitoring Suppl (ONE TOUCH ULTRA SYSTEM KIT) W/DEVICE KIT 1 kit by Does not apply route once.  1 each  0  . Calcium  Carbonate-Vitamin D (CALTRATE 600+D) 600-400 MG-UNIT per tablet Take 1 tablet by mouth daily.      . chlorpheniramine-HYDROcodone (TUSSIONEX) 10-8 MG/5ML LQCR Take 5 mLs by mouth every 12 (twelve) hours as needed.  140 mL  0  . glucose blood test strip Use as instructed  100 each  12  . Pantothenic Acid 500 MG TABS Take 1 tablet by mouth daily.        . valACYclovir (VALTREX) 1000 MG tablet Take 1 tablet (1,000 mg total) by mouth 2 (two) times daily.  20 tablet  6   No facility-administered encounter medications on file as of 09/13/2012.   BP 134/80  Pulse 58  Temp(Src) 98.2 F (36.8 C) (Oral)  Ht 5' 2.5" (1.588 m)  Wt 127 lb 4 oz (57.72 kg)  BMI 22.89 kg/m2  SpO2 96%   Review of Systems  Constitutional: Negative for fever, chills, activity change, appetite change, fatigue and unexpected weight change.  Eyes: Negative for visual disturbance.  Respiratory: Negative for cough and shortness of breath.   Cardiovascular: Negative for chest pain, palpitations and leg swelling.  Gastrointestinal: Negative for abdominal pain and abdominal distention.  Genitourinary: Negative for dysuria, urgency and difficulty urinating.  Musculoskeletal: Negative for arthralgias and gait problem.  Skin: Negative for color change and rash.  Hematological: Negative for adenopathy.  Psychiatric/Behavioral: Negative for sleep disturbance and dysphoric mood. The patient is not nervous/anxious.        Objective:   Physical Exam  Constitutional: He is oriented to person, place, and time. He appears well-developed and well-nourished. No distress.  HENT:  Head: Normocephalic and atraumatic.  Right Ear: External ear normal.  Left Ear: External ear normal.  Nose: Nose normal.  Mouth/Throat: Oropharynx is clear and moist. No oropharyngeal exudate.  Eyes: Conjunctivae and EOM are normal. Pupils are equal, round, and reactive to light. Right eye exhibits no discharge. Left eye exhibits no discharge. No scleral  icterus.  Neck: Normal range of motion. Neck supple. No tracheal deviation present. No thyromegaly present.  Cardiovascular: Normal rate, regular rhythm and normal heart sounds.  Exam reveals no gallop and no friction rub.   No murmur heard. Pulmonary/Chest: Effort normal and breath sounds normal. No accessory muscle usage. Not tachypneic. No respiratory distress. He has no decreased breath sounds. He has no wheezes. He has no rhonchi. He has no rales. He exhibits no tenderness.  Abdominal: Soft. Bowel sounds are normal. He exhibits no distension and no mass. There is no tenderness. There is no rebound and no guarding.  Musculoskeletal: Normal range of motion. He exhibits no edema.  Lymphadenopathy:    He has no cervical adenopathy.  Neurological: He is alert and oriented to person, place, and time. No cranial nerve deficit. Coordination normal.  Skin: Skin is warm and dry. No rash noted. He is not diaphoretic. No erythema. No pallor.  Psychiatric: He has  a normal mood and affect. His behavior is normal. Judgment and thought content normal.          Assessment & Plan:

## 2012-09-13 NOTE — Assessment & Plan Note (Signed)
General medical exam normal today. EKG normal. Appropriate health screening performed. Pt is UTD on health maintenance. HCPOA in place. Reviewed recent labs showing elevated fasting blood glucose and A1c consistent with borderline DM. Will request records from urologist on recent prostate exam and PSA. Follow up 1 year for physical exam.

## 2012-09-13 NOTE — Assessment & Plan Note (Signed)
>>  ASSESSMENT AND PLAN FOR ELEVATED BLOOD SUGAR WRITTEN ON 09/13/2012  6:25 PM BY Ronna Polio A, MD  Lab Results  Component Value Date   HGBA1C 6.3 09/05/2012   Borderline DM based on recent labs. Pt has controlled BG well over the years with diet and exercise. Will continue. Follow up with A1c in 3 months. We discussed potentially starting metformin if BG increased.

## 2012-10-17 ENCOUNTER — Ambulatory Visit (INDEPENDENT_AMBULATORY_CARE_PROVIDER_SITE_OTHER): Payer: Medicare Other | Admitting: Internal Medicine

## 2012-10-17 ENCOUNTER — Encounter: Payer: Self-pay | Admitting: Internal Medicine

## 2012-10-17 VITALS — BP 130/80 | HR 52 | Temp 97.8°F | Wt 129.0 lb

## 2012-10-17 DIAGNOSIS — H2510 Age-related nuclear cataract, unspecified eye: Secondary | ICD-10-CM | POA: Diagnosis not present

## 2012-10-17 LAB — HM DIABETES EYE EXAM

## 2012-10-17 NOTE — Progress Notes (Signed)
Subjective:    Patient ID: Seth Baxter, male    DOB: 03-Jan-1941, 72 y.o.   MRN: [id number]  HPI 73YO male with h/o migraine headaches presents for acute visit. Complains of 24hr of visual disturbance, described as black opacity at 3o'clock in visual field. No eye pain. No photophobia. Occasionally has aura preceding headaches, but no headache today. Mild nausea noted. No other focal neurologic symptoms.  Outpatient Encounter Prescriptions as of 10/17/2012  Medication Sig Dispense Refill  . acetaminophen (TYLENOL ARTHRITIS PAIN) 650 MG CR tablet Take 650 mg by mouth 3 (three) times daily.        Marland Kitchen amLODipine (NORVASC) 10 MG tablet TAKE 1 TABLET DAILY  90 tablet  3  . Blood Glucose Monitoring Suppl (ONE TOUCH ULTRA SYSTEM KIT) W/DEVICE KIT 1 kit by Does not apply route once.  1 each  0  . butalbital-aspirin-caffeine (FIORINAL) 50-325-40 MG per capsule Take 1 capsule by mouth 2 (two) times daily as needed.  60 capsule  3  . chlorpheniramine (CHLOR-TRIMETON) 4 MG tablet Take 4 mg by mouth as needed.        . CRESTOR 5 MG tablet TAKE 1 TABLET DAILY  90 tablet  3  . ESZOPICLONE 3 MG tablet TAKE 1 TABLET EVERY NIGHT AT BEDTIME  30 tablet  3  . glucose blood test strip Use as instructed  100 each  12  . meloxicam (MOBIC) 15 MG tablet Take 15 mg by mouth daily.        . metoprolol succinate (TOPROL-XL) 25 MG 24 hr tablet TAKE 1 TABLET TWICE DAILY  180 tablet  3  . quinapril (ACCUPRIL) 40 MG tablet TAKE 1 TABLET DAILY  90 tablet  3  . traMADol (ULTRAM) 50 MG tablet Take 50 mg by mouth as needed.        . vardenafil (LEVITRA) 20 MG tablet Take 1 tablet (20 mg total) by mouth daily as needed for erectile dysfunction.  6 tablet  5  . Calcium Carbonate-Vitamin D (CALTRATE 600+D) 600-400 MG-UNIT per tablet Take 1 tablet by mouth daily.      . chlorpheniramine-HYDROcodone (TUSSIONEX) 10-8 MG/5ML LQCR Take 5 mLs by mouth every 12 (twelve) hours as needed.  140 mL  0  . Pantothenic Acid 500 MG TABS Take 1  tablet by mouth daily.        . valACYclovir (VALTREX) 1000 MG tablet Take 1 tablet (1,000 mg total) by mouth 2 (two) times daily.  20 tablet  6   No facility-administered encounter medications on file as of 10/17/2012.   BP 130/80  Pulse 52  Temp(Src) 97.8 F (36.6 C) (Oral)  Wt 129 lb (58.514 kg)  BMI 23.2 kg/m2  SpO2 97%  Review of Systems  Constitutional: Negative for fever, chills, activity change, appetite change, fatigue and unexpected weight change.  Eyes: Positive for visual disturbance. Negative for photophobia and pain.  Respiratory: Negative for cough and shortness of breath.   Cardiovascular: Negative for chest pain, palpitations and leg swelling.  Gastrointestinal: Positive for nausea. Negative for abdominal pain and abdominal distention.  Genitourinary: Negative for dysuria, urgency and difficulty urinating.  Musculoskeletal: Negative for arthralgias and gait problem.  Skin: Negative for color change and rash.  Hematological: Negative for adenopathy.  Psychiatric/Behavioral: Negative for sleep disturbance and dysphoric mood. The patient is not nervous/anxious.        Objective:   Physical Exam  Constitutional: He is oriented to person, place, and time. He appears well-developed and well-nourished. No  distress.  HENT:  Head: Normocephalic and atraumatic.  Right Ear: External ear normal.  Left Ear: External ear normal.  Nose: Nose normal.  Mouth/Throat: Oropharynx is clear and moist. No oropharyngeal exudate.  Eyes: Conjunctivae and EOM are normal. Pupils are equal, round, and reactive to light. Right eye exhibits no discharge. Left eye exhibits no discharge. No scleral icterus.    Neck: Normal range of motion. Neck supple. No tracheal deviation present. No thyromegaly present.  Cardiovascular: Normal rate, regular rhythm and normal heart sounds.  Exam reveals no gallop and no friction rub.   No murmur heard. Pulmonary/Chest: Effort normal and breath sounds  normal. No respiratory distress. He has no wheezes. He has no rales. He exhibits no tenderness.  Musculoskeletal: Normal range of motion. He exhibits no edema.  Lymphadenopathy:    He has no cervical adenopathy.  Neurological: He is alert and oriented to person, place, and time. No cranial nerve deficit. Coordination normal.  Skin: Skin is warm and dry. No rash noted. He is not diaphoretic. No erythema. No pallor.  Psychiatric: He has a normal mood and affect. His behavior is normal. Judgment and thought content normal.          Assessment & Plan:

## 2012-12-11 ENCOUNTER — Other Ambulatory Visit: Payer: Self-pay | Admitting: Internal Medicine

## 2012-12-12 ENCOUNTER — Encounter: Payer: Self-pay | Admitting: Internal Medicine

## 2012-12-18 ENCOUNTER — Encounter: Payer: Self-pay | Admitting: Internal Medicine

## 2012-12-18 ENCOUNTER — Ambulatory Visit (INDEPENDENT_AMBULATORY_CARE_PROVIDER_SITE_OTHER): Payer: Medicare Other | Admitting: Internal Medicine

## 2012-12-18 VITALS — BP 132/88 | HR 53 | Temp 97.9°F | Wt 126.0 lb

## 2012-12-18 DIAGNOSIS — I10 Essential (primary) hypertension: Secondary | ICD-10-CM | POA: Diagnosis not present

## 2012-12-18 LAB — COMPREHENSIVE METABOLIC PANEL
ALT: 29 U/L (ref 0–53)
AST: 33 U/L (ref 0–37)
Albumin: 4.2 g/dL (ref 3.5–5.2)
Alkaline Phosphatase: 88 U/L (ref 39–117)
Potassium: 4.7 mEq/L (ref 3.5–5.1)
Sodium: 140 mEq/L (ref 135–145)
Total Bilirubin: 0.5 mg/dL (ref 0.3–1.2)
Total Protein: 7.2 g/dL (ref 6.0–8.3)

## 2012-12-18 NOTE — Assessment & Plan Note (Signed)
>>  ASSESSMENT AND PLAN FOR ELEVATED BLOOD SUGAR WRITTEN ON 12/18/2012  8:17 AM BY WALKER, JENNIFER A, MD  BG was slightly elevated in 09/2012 with A1c 6.3%. Pt now following low glycemic diet and exercising regularly. Will recheck a1c with labs today. Encouraged continued healthy lifestyle.

## 2012-12-18 NOTE — Assessment & Plan Note (Signed)
BG was slightly elevated in 09/2012 with A1c 6.3%. Pt now following low glycemic diet and exercising regularly. Will recheck a1c with labs today. Encouraged continued healthy lifestyle.

## 2012-12-18 NOTE — Assessment & Plan Note (Signed)
BP Readings from Last 3 Encounters:  12/18/12 132/88  10/17/12 130/80  09/13/12 134/80   BP well controlled on current medications. Will continue.

## 2012-12-18 NOTE — Progress Notes (Signed)
Subjective:    Patient ID: Seth Baxter, male    DOB: [birth year], 72 y.o.   MRN: [id number]  HPI 72YO male with HTN, borderline elevated BG presents for follow up. Doing well. Following low glycemic diet. Exercises daily. No new concerns.  Outpatient Encounter Prescriptions as of 12/18/2012  Medication Sig Dispense Refill  . acetaminophen (TYLENOL ARTHRITIS PAIN) 650 MG CR tablet Take 650 mg by mouth 3 (three) times daily.        Marland Kitchen amLODipine (NORVASC) 10 MG tablet TAKE 1 TABLET DAILY  90 tablet  3  . Blood Glucose Monitoring Suppl (ONE TOUCH ULTRA SYSTEM KIT) W/DEVICE KIT 1 kit by Does not apply route once.  1 each  0  . butalbital-aspirin-caffeine (FIORINAL) 50-325-40 MG per capsule Take 1 capsule by mouth 2 (two) times daily as needed.  60 capsule  3  . Calcium Carbonate-Vitamin D (CALTRATE 600+D) 600-400 MG-UNIT per tablet Take 1 tablet by mouth daily.      . chlorpheniramine (CHLOR-TRIMETON) 4 MG tablet Take 4 mg by mouth as needed.        . CRESTOR 5 MG tablet TAKE 1 TABLET DAILY  90 tablet  3  . ESZOPICLONE 3 MG tablet TAKE 1 TABLET EVERY NIGHT AT BEDTIME  30 tablet  2  . glucose blood test strip Use as instructed  100 each  12  . meloxicam (MOBIC) 15 MG tablet Take 15 mg by mouth daily.        . metoprolol succinate (TOPROL-XL) 25 MG 24 hr tablet TAKE 1 TABLET TWICE DAILY  180 tablet  3  . quinapril (ACCUPRIL) 40 MG tablet TAKE 1 TABLET DAILY  90 tablet  3  . sildenafil (VIAGRA) 50 MG tablet Take 50 mg by mouth daily as needed for erectile dysfunction.      . traMADol (ULTRAM) 50 MG tablet Take 50 mg by mouth as needed.        . valACYclovir (VALTREX) 1000 MG tablet Take 1 tablet (1,000 mg total) by mouth 2 (two) times daily.  20 tablet  6  . chlorpheniramine-HYDROcodone (TUSSIONEX) 10-8 MG/5ML LQCR Take 5 mLs by mouth every 12 (twelve) hours as needed.  140 mL  0  . Pantothenic Acid 500 MG TABS Take 1 tablet by mouth daily.        . vardenafil (LEVITRA) 20 MG tablet Take 1 tablet  (20 mg total) by mouth daily as needed for erectile dysfunction.  6 tablet  5   No facility-administered encounter medications on file as of 12/18/2012.   BP 132/88  Pulse 53  Temp(Src) 97.9 F (36.6 C) (Oral)  Wt 126 lb (57.153 kg)  BMI 22.66 kg/m2  SpO2 98%  Review of Systems  Constitutional: Negative for fever, chills, activity change, appetite change, fatigue and unexpected weight change.  Eyes: Negative for visual disturbance.  Respiratory: Negative for cough and shortness of breath.   Cardiovascular: Negative for chest pain, palpitations and leg swelling.  Gastrointestinal: Negative for abdominal pain and abdominal distention.  Genitourinary: Negative for dysuria, urgency and difficulty urinating.  Musculoskeletal: Negative for arthralgias and gait problem.  Skin: Negative for color change and rash.  Hematological: Negative for adenopathy.  Psychiatric/Behavioral: Negative for sleep disturbance and dysphoric mood. The patient is not nervous/anxious.        Objective:   Physical Exam  Constitutional: He is oriented to person, place, and time. He appears well-developed and well-nourished. No distress.  HENT:  Head: Normocephalic and atraumatic.  Right Ear:  External ear normal.  Left Ear: External ear normal.  Nose: Nose normal.  Mouth/Throat: Oropharynx is clear and moist. No oropharyngeal exudate.  Eyes: Conjunctivae and EOM are normal. Pupils are equal, round, and reactive to light. Right eye exhibits no discharge. Left eye exhibits no discharge. No scleral icterus.  Neck: Normal range of motion. Neck supple. No tracheal deviation present. No thyromegaly present.  Cardiovascular: Normal rate, regular rhythm and normal heart sounds.  Exam reveals no gallop and no friction rub.   No murmur heard. Pulmonary/Chest: Effort normal and breath sounds normal. No respiratory distress. He has no wheezes. He has no rales. He exhibits no tenderness.  Musculoskeletal: Normal range of  motion. He exhibits no edema.  Lymphadenopathy:    He has no cervical adenopathy.  Neurological: He is alert and oriented to person, place, and time. No cranial nerve deficit. Coordination normal.  Skin: Skin is warm and dry. No rash noted. He is not diaphoretic. No erythema. No pallor.  Psychiatric: He has a normal mood and affect. His behavior is normal. Judgment and thought content normal.          Assessment & Plan:

## 2012-12-19 ENCOUNTER — Encounter: Payer: Self-pay | Admitting: Internal Medicine

## 2013-01-04 DIAGNOSIS — IMO0002 Reserved for concepts with insufficient information to code with codable children: Secondary | ICD-10-CM | POA: Diagnosis not present

## 2013-01-22 ENCOUNTER — Other Ambulatory Visit: Payer: Self-pay | Admitting: Internal Medicine

## 2013-01-22 NOTE — Telephone Encounter (Signed)
Eprescribed.

## 2013-02-28 ENCOUNTER — Encounter: Payer: Self-pay | Admitting: Internal Medicine

## 2013-03-01 ENCOUNTER — Other Ambulatory Visit: Payer: Self-pay | Admitting: *Deleted

## 2013-03-01 MED ORDER — BUTALBITAL-ASPIRIN-CAFFEINE 50-325-40 MG PO CAPS: 1.0000 | ORAL_CAPSULE | Freq: Two times a day (BID) | ORAL | Status: DC | PRN

## 2013-03-01 NOTE — Telephone Encounter (Signed)
Rx faxed to pharmacy  

## 2013-03-20 ENCOUNTER — Other Ambulatory Visit: Payer: Self-pay | Admitting: Internal Medicine

## 2013-03-28 ENCOUNTER — Encounter: Payer: Self-pay | Admitting: *Deleted

## 2013-04-06 ENCOUNTER — Encounter: Payer: Self-pay | Admitting: Internal Medicine

## 2013-05-28 ENCOUNTER — Encounter: Payer: Self-pay | Admitting: Internal Medicine

## 2013-05-28 MED ORDER — VARDENAFIL HCL 20 MG PO TABS: 20.0000 mg | ORAL_TABLET | Freq: Every day | ORAL | Status: DC | PRN

## 2013-05-31 ENCOUNTER — Encounter: Payer: Self-pay | Admitting: Internal Medicine

## 2013-05-31 ENCOUNTER — Telehealth: Payer: Self-pay | Admitting: Internal Medicine

## 2013-05-31 NOTE — Telephone Encounter (Signed)
We can call in Viagra 50mg  po daily prn #30 with 3 refills. This is the first I have heard of this. Maybe [facility] sending to wrong [facility].

## 2013-05-31 NOTE — Telephone Encounter (Signed)
Pt states Levitra was prescribed at previous visit.  States his pharmacy will not fill it as his insurance wants him to try Viagra first.  Pt states [facility] request for new rx for Viagra but has not heard back. Pt states Viagra is fine with him.  Pt states he needs this filled by tomorrow to be able to use his [employer] spending card.  Original rx was sent 10/27.

## 2013-05-31 NOTE — Telephone Encounter (Signed)
Please advise 

## 2013-06-01 ENCOUNTER — Other Ambulatory Visit: Payer: Self-pay | Admitting: Internal Medicine

## 2013-06-01 MED ORDER — SILDENAFIL CITRATE 50 MG PO TABS: 50.0000 mg | ORAL_TABLET | Freq: Every day | ORAL | Status: DC | PRN

## 2013-06-01 NOTE — Telephone Encounter (Signed)
Prescription has been sent to the pharmacy and patient informed via Mychart.

## 2013-06-12 ENCOUNTER — Other Ambulatory Visit: Payer: Self-pay | Admitting: Internal Medicine

## 2013-06-15 ENCOUNTER — Other Ambulatory Visit: Payer: Self-pay | Admitting: Internal Medicine

## 2013-06-21 ENCOUNTER — Encounter: Payer: Self-pay | Admitting: Internal Medicine

## 2013-06-21 ENCOUNTER — Ambulatory Visit (INDEPENDENT_AMBULATORY_CARE_PROVIDER_SITE_OTHER): Payer: Medicare Other | Admitting: Internal Medicine

## 2013-06-21 VITALS — BP 140/90 | HR 55 | Temp 98.1°F | Wt 130.0 lb

## 2013-06-21 DIAGNOSIS — G4700 Insomnia, unspecified: Secondary | ICD-10-CM

## 2013-06-21 DIAGNOSIS — I10 Essential (primary) hypertension: Secondary | ICD-10-CM

## 2013-06-21 LAB — COMPREHENSIVE METABOLIC PANEL
ALT: 23 U/L (ref 0–53)
AST: 20 U/L (ref 0–37)
Albumin: 4.2 g/dL (ref 3.5–5.2)
Alkaline Phosphatase: 89 U/L (ref 39–117)
BUN: 24 mg/dL — ABNORMAL HIGH (ref 6–23)
CO2: 30 mEq/L (ref 19–32)
Calcium: 9.5 mg/dL (ref 8.4–10.5)
Chloride: 104 mEq/L (ref 96–112)
Creatinine, Ser: 0.9 mg/dL (ref 0.4–1.5)
GFR: 91.37 mL/min (ref >60.00)
Glucose, Bld: 116 mg/dL — ABNORMAL HIGH (ref 70–99)
Potassium: 4.9 mEq/L (ref 3.5–5.1)
Sodium: 140 mEq/L (ref 135–145)
Total Bilirubin: 0.6 mg/dL (ref 0.3–1.2)
Total Protein: 7.1 g/dL (ref 6.0–8.3)

## 2013-06-21 LAB — HEMOGLOBIN A1C: Hgb A1c MFr Bld: 6.3 % (ref 4.6–6.5)

## 2013-06-21 MED ORDER — METOPROLOL SUCCINATE ER 50 MG PO TB24: 50.0000 mg | ORAL_TABLET | Freq: Every day | ORAL | Status: DC

## 2013-06-21 MED ORDER — ESZOPICLONE 3 MG PO TABS: ORAL_TABLET | ORAL | Status: DC

## 2013-06-21 MED ORDER — METOPROLOL SUCCINATE ER 25 MG PO TB24: 25.0000 mg | ORAL_TABLET | Freq: Two times a day (BID) | ORAL | Status: DC

## 2013-06-21 MED ORDER — METOPROLOL SUCCINATE ER 50 MG PO TB24: 25.0000 mg | ORAL_TABLET | Freq: Two times a day (BID) | ORAL | Status: DC

## 2013-06-21 NOTE — Patient Instructions (Signed)
Increase Metoprolol to 50mg  daily. Email or call with blood pressure readings 1-2 times weekly.  Follow up 6 months.

## 2013-06-21 NOTE — Assessment & Plan Note (Signed)
BP Readings from Last 3 Encounters:  06/21/13 140/90  12/18/12 132/88  10/17/12 130/80   Blood pressure slightly elevated recently. For now, we'll continue to monitor. If persistently elevated greater than 140/90, would favor adding hydrochlorothiazide.

## 2013-06-21 NOTE — Assessment & Plan Note (Signed)
Labs today show that blood sugars are well controlled, in the range of borderline or increased risk for diabetes. Encouraged continued healthy diet and regular exercise.

## 2013-06-21 NOTE — Assessment & Plan Note (Signed)
Symptoms are well controlled with Lunesta. We'll continue.

## 2013-06-21 NOTE — Progress Notes (Signed)
Subjective:    Patient ID: Seth Baxter, male    DOB: October 15, 1940, 72 y.o.   MRN: [id number]  HPI 72 year old male with history of hypertension, hyperlipidemia, chronic insomnia presents for followup. He brings record of his blood pressures today which show most readings near 150/90. He denies any headache, chest pain, palpitations. He is very active, exercising at a local gym daily. He is compliant with medication. He denies any new concerns today.  Outpatient Encounter Prescriptions as of 06/21/2013  Medication Sig  . acetaminophen (TYLENOL ARTHRITIS PAIN) 650 MG CR tablet Take 650 mg by mouth 3 (three) times daily.    Marland Kitchen amLODipine (NORVASC) 10 MG tablet TAKE 1 TABLET DAILY  . Blood Glucose Monitoring Suppl (ONE TOUCH ULTRA SYSTEM KIT) W/DEVICE KIT 1 kit by Does not apply route once.  . butalbital-aspirin-caffeine (FIORINAL) 50-325-40 MG per capsule Take 1 capsule by mouth 2 (two) times daily as needed.  . Calcium Carbonate-Vitamin D (CALTRATE 600+D) 600-400 MG-UNIT per tablet Take 1 tablet by mouth daily.  . chlorpheniramine (CHLOR-TRIMETON) 4 MG tablet Take 4 mg by mouth as needed.    . CRESTOR 5 MG tablet TAKE 1 TABLET DAILY  . Eszopiclone 3 MG TABS TAKE 1 TABLET EVERY NIGHT AT BEDTIME  . glucose blood test strip Use as instructed  . meloxicam (MOBIC) 15 MG tablet Take 15 mg by mouth daily.    . quinapril (ACCUPRIL) 40 MG tablet TAKE 1 TABLET DAILY  . sildenafil (VIAGRA) 50 MG tablet Take 1 tablet (50 mg total) by mouth daily as needed for erectile dysfunction.  . traMADol (ULTRAM) 50 MG tablet Take 50 mg by mouth as needed.     BP 140/90  Pulse 55  Temp(Src) 98.1 F (36.7 C) (Oral)  Wt 130 lb (58.968 kg)  SpO2 97%  Review of Systems  Constitutional: Negative for fever, chills, activity change, appetite change, fatigue and unexpected weight change.  Eyes: Negative for visual disturbance.  Respiratory: Negative for cough and shortness of breath.   Cardiovascular: Negative for  chest pain, palpitations and leg swelling.  Gastrointestinal: Negative for abdominal pain and abdominal distention.  Genitourinary: Negative for dysuria, urgency and difficulty urinating.  Musculoskeletal: Negative for arthralgias and gait problem.  Skin: Negative for color change and rash.  Hematological: Negative for adenopathy.  Psychiatric/Behavioral: Negative for sleep disturbance and dysphoric mood. The patient is not nervous/anxious.        Objective:   Physical Exam  Constitutional: He is oriented to person, place, and time. He appears well-developed and well-nourished. No distress.  HENT:  Head: Normocephalic and atraumatic.  Right Ear: External ear normal.  Left Ear: External ear normal.  Nose: Nose normal.  Mouth/Throat: Oropharynx is clear and moist. No oropharyngeal exudate.  Eyes: Conjunctivae and EOM are normal. Pupils are equal, round, and reactive to light. Right eye exhibits no discharge. Left eye exhibits no discharge. No scleral icterus.  Neck: Normal range of motion. Neck supple. No tracheal deviation present. No thyromegaly present.  Cardiovascular: Normal rate, regular rhythm and normal heart sounds.  Exam reveals no gallop and no friction rub.   No murmur heard. Pulmonary/Chest: Effort normal and breath sounds normal. No respiratory distress. He has no wheezes. He has no rales. He exhibits no tenderness.  Musculoskeletal: Normal range of motion. He exhibits no edema.  Lymphadenopathy:    He has no cervical adenopathy.  Neurological: He is alert and oriented to person, place, and time. No cranial nerve deficit. Coordination normal.  Skin: Skin  is warm and dry. No rash noted. He is not diaphoretic. No erythema. No pallor.  Psychiatric: He has a normal mood and affect. His behavior is normal. Judgment and thought content normal.          Assessment & Plan:

## 2013-06-21 NOTE — Assessment & Plan Note (Signed)
>>  ASSESSMENT AND PLAN FOR ELEVATED BLOOD SUGAR WRITTEN ON 06/21/2013 11:50 AM BY WALKER, JENNIFER A, MD  Labs today show that blood sugars are well controlled, in the range of borderline or increased risk for diabetes. Encouraged continued healthy diet and regular exercise.

## 2013-06-21 NOTE — Progress Notes (Signed)
Pre-visit discussion using our clinic review tool. No additional management support is needed unless otherwise documented below in the visit note.  

## 2013-06-27 ENCOUNTER — Encounter: Payer: Self-pay | Admitting: Internal Medicine

## 2013-06-27 DIAGNOSIS — I10 Essential (primary) hypertension: Secondary | ICD-10-CM

## 2013-07-03 ENCOUNTER — Other Ambulatory Visit: Payer: Self-pay | Admitting: Internal Medicine

## 2013-07-06 ENCOUNTER — Encounter: Payer: Self-pay | Admitting: Internal Medicine

## 2013-07-09 ENCOUNTER — Other Ambulatory Visit: Payer: Self-pay

## 2013-07-09 ENCOUNTER — Encounter: Payer: Self-pay | Admitting: Cardiovascular Disease

## 2013-07-09 ENCOUNTER — Ambulatory Visit (INDEPENDENT_AMBULATORY_CARE_PROVIDER_SITE_OTHER): Payer: Medicare Other | Admitting: Cardiovascular Disease

## 2013-07-09 ENCOUNTER — Encounter: Payer: Self-pay | Admitting: Internal Medicine

## 2013-07-09 VITALS — BP 155/91 | HR 52 | Ht 64.0 in | Wt 129.5 lb

## 2013-07-09 DIAGNOSIS — I10 Essential (primary) hypertension: Secondary | ICD-10-CM | POA: Diagnosis not present

## 2013-07-09 MED ORDER — NEBIVOLOL HCL 10 MG PO TABS: 10.0000 mg | ORAL_TABLET | Freq: Every day | ORAL | Status: DC
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
## 2013-07-09 NOTE — Patient Instructions (Signed)
Your physician has recommended you make the following change in your medication: Stop Toprol  Start Bystolic 10mg  daily   Your physician recommends that you schedule a follow-up appointment in: 3 months

## 2013-07-09 NOTE — Assessment & Plan Note (Addendum)
Seth Baxter is  a very healthy 72 year old man with a long history of hypertension. He has a strong family history of hypertension. He presents today for persistent hypertension despite being on Toprol, amlodipine 10 mg a day, Accupril 40 mg a. He eats a fairly low salt diet although he has a little bit room to improve. He's very active and runs 6 miles each day-5 times a week.  Althought  Toprol is a very good heart rate slowing medication, I think it will have better blood pressure lowering if we try him on Bytolic. We'll discontinue the Toprol and start him on Bystolic 10 mg a day.    It was some concern about a slow heart rate. He has no episodes of syncope. I suspect that he has markedly reduced vagal tone because of his active lifestyle and running program.  I do not have any concerns about his heart rate of 52.   I'll see him back in the office in 3 months for followup visit.

## 2013-07-15 ENCOUNTER — Encounter: Payer: Self-pay | Admitting: Internal Medicine

## 2013-07-15 ENCOUNTER — Encounter: Payer: Self-pay | Admitting: Cardiovascular Disease

## 2013-07-16 ENCOUNTER — Other Ambulatory Visit: Payer: Self-pay | Admitting: *Deleted

## 2013-07-16 DIAGNOSIS — I10 Essential (primary) hypertension: Secondary | ICD-10-CM

## 2013-07-16 MED ORDER — NEBIVOLOL HCL 20 MG PO TABS: 20.0000 mg | ORAL_TABLET | Freq: Every day | ORAL | Status: DC

## 2013-07-16 NOTE — Progress Notes (Signed)
See [facility] message/ med was increased per conversation with dr Elease Hashimoto.

## 2013-07-25 ENCOUNTER — Encounter: Payer: Self-pay | Admitting: Internal Medicine

## 2013-07-25 DIAGNOSIS — IMO0002 Reserved for concepts with insufficient information to code with codable children: Secondary | ICD-10-CM | POA: Diagnosis not present

## 2013-07-25 MED ORDER — BUTALBITAL-ASPIRIN-CAFFEINE 50-325-40 MG PO CAPS: 1.0000 | ORAL_CAPSULE | Freq: Two times a day (BID) | ORAL | Status: DC | PRN

## 2013-08-13 ENCOUNTER — Encounter: Payer: Self-pay | Admitting: Cardiovascular Disease

## 2013-08-15 MED ORDER — NEBIVOLOL HCL 10 MG PO TABS: 10.0000 mg | ORAL_TABLET | Freq: Two times a day (BID) | ORAL | Status: DC

## 2013-08-15 NOTE — Telephone Encounter (Signed)
-----   Message ----- From: Seth Baxter Sent: 08/14/2013 5:21 PM To: [name] [address] Triage Subject: RE: Non-Urgent Medical Question [place], Thanks. If she gets to much flak from the pharmacy go ahead and order the 20mg  and I will take it once a day. Seth Baxter ----- Message ----- From: Darden Amber., MD Sent: 08/14/2013 5:34 AM EST To: Seth Baxter Subject: RE: Non-Urgent Medical Question Hi Joe, I will ask [name] to update your prescription. Sometimes the insurance company drug plans are picky about giving 10 BID instead of 20 a day on meds but we will try. Keep me informed of the BP levels ----- Message ----- From: Seth Baxter Sent: 08/13/2013 12:17 PM EST To: Darden Amber., MD Subject: Non-Urgent Medical Question Hi I need you to call in another RX for Bystolic 10mg  QUANTITY 60. I am taking 20mg  a day and split the dosage up in1 BID. If I take the 20mg  in the morning by the evening my B/P has risen up in the 160's. So I decided to go BID. But when you RX the meds you only gave me 30, so I run out mid month. Sending B/p's in next message. Seth Baxter  Informed patient via telephone that med was changed.

## 2013-10-01 ENCOUNTER — Encounter: Payer: Self-pay | Admitting: Cardiovascular Disease

## 2013-10-01 ENCOUNTER — Ambulatory Visit (INDEPENDENT_AMBULATORY_CARE_PROVIDER_SITE_OTHER): Payer: Medicare Other | Admitting: Cardiovascular Disease

## 2013-10-01 VITALS — BP 142/75 | HR 47 | Ht 63.0 in | Wt 131.5 lb

## 2013-10-01 DIAGNOSIS — R079 Chest pain, unspecified: Secondary | ICD-10-CM | POA: Diagnosis not present

## 2013-10-01 DIAGNOSIS — I10 Essential (primary) hypertension: Secondary | ICD-10-CM | POA: Diagnosis not present
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
## 2013-10-01 NOTE — Assessment & Plan Note (Signed)
Seth Baxter seems to be doing well. His blood pressure is well controlled. He's not having any symptoms.  At this point we will have him followup with his medical Dr. I will be happy to see him in the future if needed. He's to continue with current medications and he should continue his current medical regimen.

## 2013-10-01 NOTE — Patient Instructions (Signed)
Your physician recommends that you continue on your current medications as directed. Please refer to the Current Medication list given to you today.  Call or return to clinic prn if these symptoms worsen or fail to improve as anticipated.  

## 2013-10-23 ENCOUNTER — Other Ambulatory Visit: Payer: Self-pay | Admitting: Internal Medicine

## 2013-11-01 ENCOUNTER — Ambulatory Visit (INDEPENDENT_AMBULATORY_CARE_PROVIDER_SITE_OTHER): Payer: Medicare Other | Admitting: Internal Medicine

## 2013-11-01 ENCOUNTER — Encounter: Payer: Self-pay | Admitting: Internal Medicine

## 2013-11-01 ENCOUNTER — Telehealth: Payer: Self-pay | Admitting: Internal Medicine

## 2013-11-01 VITALS — BP 144/86 | HR 50 | Temp 98.2°F | Wt 132.0 lb

## 2013-11-01 DIAGNOSIS — J0100 Acute maxillary sinusitis, unspecified: Secondary | ICD-10-CM | POA: Diagnosis not present

## 2013-11-01 MED ORDER — AMOXICILLIN-POT CLAVULANATE 875-125 MG PO TABS: 1.0000 | ORAL_TABLET | Freq: Two times a day (BID) | ORAL | Status: DC

## 2013-11-01 NOTE — Telephone Encounter (Signed)
Appointment made

## 2013-11-01 NOTE — Telephone Encounter (Signed)
Pt notified and verbalized understanding, will take 4:30 appt today. Can you please add him to Dr. Thomes Dinning schedule?

## 2013-11-01 NOTE — Telephone Encounter (Signed)
Returned patient's call, phone went directly to voicemail. Left message to call back

## 2013-11-01 NOTE — Patient Instructions (Signed)

## 2013-11-01 NOTE — Progress Notes (Signed)
Pre visit review using our clinic review tool, if applicable. No additional management support is needed unless otherwise documented below in the visit note. 

## 2013-11-01 NOTE — Telephone Encounter (Signed)
Patient returned call and left message on my voicemail. Returned patient call, he stated he has a sinus infection and he usually gets them about this time of year. He is blowing out green stuff with some drainage, would like to know if you would call something in.

## 2013-11-01 NOTE — Telephone Encounter (Signed)
Per our office policy, he will need to be seen. We can add him at 4:30pm if he would like.

## 2013-11-01 NOTE — Assessment & Plan Note (Signed)
Symptoms and exam consistent with maxillary sinusitis. Will treat with Augmentin. Continue prn Tylenol, Ibuprofen, Sudafed. Pt will call if symptoms are not improving.

## 2013-11-01 NOTE — Telephone Encounter (Signed)
The patient has a sinus infection and wants a call from the nurse. The patient is wanting a prescription called to the pharmacy.

## 2013-11-01 NOTE — Progress Notes (Signed)
   Subjective:    Patient ID: Seth Baxter, male    DOB: [birth year], 73 y.o.   MRN: [id number]  HPI 74YO male presents for acute visit.  URI symptoms last week, congestion, sore throat, cough. No fever. Now with sinus pressure, purulent drainage.  Took Dayquil and NyQuil with no improvement. Symptoms consistent with previous episodes of sinusitis.   Review of Systems  Constitutional: Positive for fatigue. Negative for fever, chills and activity change.  HENT: Positive for postnasal drip and sinus pressure. Negative for congestion, ear discharge, ear pain, hearing loss, nosebleeds, rhinorrhea, sneezing, sore throat, tinnitus, trouble swallowing and voice change.   Eyes: Negative for discharge, redness, itching and visual disturbance.  Respiratory: Positive for shortness of breath. Negative for cough, chest tightness, wheezing and stridor.   Cardiovascular: Negative for chest pain and leg swelling.  Musculoskeletal: Negative for arthralgias, myalgias, neck pain and neck stiffness.  Skin: Negative for color change and rash.  Neurological: Negative for dizziness, facial asymmetry and headaches.  Psychiatric/Behavioral: Negative for sleep disturbance.       Objective:    BP 144/86  Pulse 50  Temp(Src) 98.2 F (36.8 C) (Oral)  Wt 132 lb (59.875 kg)  SpO2 94% Physical Exam  Constitutional: He is oriented to person, place, and time. He appears well-developed and well-nourished. No distress.  HENT:  Head: Normocephalic and atraumatic.  Right Ear: External ear normal.  Left Ear: External ear normal.  Nose: Mucosal edema present. Right sinus exhibits maxillary sinus tenderness. Left sinus exhibits maxillary sinus tenderness.  Mouth/Throat: Oropharynx is clear and moist. No oropharyngeal exudate.  Eyes: Conjunctivae and EOM are normal. Pupils are equal, round, and reactive to light. Right eye exhibits no discharge. Left eye exhibits no discharge. No scleral icterus.  Neck: Normal range of  motion. Neck supple. No tracheal deviation present. No thyromegaly present.  Cardiovascular: Normal rate, regular rhythm and normal heart sounds.  Exam reveals no gallop and no friction rub.   No murmur heard. Pulmonary/Chest: Effort normal and breath sounds normal. No accessory muscle usage. Not tachypneic. No respiratory distress. He has no decreased breath sounds. He has no wheezes. He has no rhonchi. He has no rales. He exhibits no tenderness.  Musculoskeletal: Normal range of motion. He exhibits no edema.  Lymphadenopathy:    He has no cervical adenopathy.  Neurological: He is alert and oriented to person, place, and time. No cranial nerve deficit. Coordination normal.  Skin: Skin is warm and dry. No rash noted. He is not diaphoretic. No erythema. No pallor.  Psychiatric: He has a normal mood and affect. His behavior is normal. Judgment and thought content normal.          Assessment & Plan:   Problem List Items Addressed This Visit   Acute maxillary sinusitis - Primary     Symptoms and exam consistent with maxillary sinusitis. Will treat with Augmentin. Continue prn Tylenol, Ibuprofen, Sudafed. Pt will call if symptoms are not improving.    Relevant Medications      AMOXICILLIN-POT CLAVULANATE 875-125 MG PO TABS       Return if symptoms worsen or fail to improve.

## 2013-11-03 ENCOUNTER — Other Ambulatory Visit: Payer: Self-pay | Admitting: Internal Medicine

## 2013-11-06 ENCOUNTER — Encounter: Payer: Self-pay | Admitting: Internal Medicine

## 2013-11-06 ENCOUNTER — Other Ambulatory Visit: Payer: Self-pay | Admitting: Internal Medicine

## 2013-11-08 ENCOUNTER — Encounter: Payer: Self-pay | Admitting: Internal Medicine

## 2013-11-08 NOTE — Telephone Encounter (Signed)
FYI

## 2013-11-09 ENCOUNTER — Ambulatory Visit: Payer: Medicare Other | Admitting: Internal Medicine

## 2013-12-12 ENCOUNTER — Encounter: Payer: Self-pay | Admitting: Internal Medicine

## 2013-12-13 ENCOUNTER — Other Ambulatory Visit: Payer: Self-pay | Admitting: Cardiovascular Disease

## 2013-12-18 ENCOUNTER — Other Ambulatory Visit: Payer: Self-pay | Admitting: Internal Medicine

## 2013-12-18 NOTE — Telephone Encounter (Signed)
Ok refill? 

## 2013-12-20 ENCOUNTER — Ambulatory Visit: Payer: Medicare Other | Admitting: Internal Medicine

## 2013-12-21 ENCOUNTER — Encounter: Payer: Self-pay | Admitting: Internal Medicine

## 2013-12-21 ENCOUNTER — Other Ambulatory Visit: Payer: Self-pay | Admitting: Internal Medicine

## 2013-12-21 DIAGNOSIS — G4700 Insomnia, unspecified: Secondary | ICD-10-CM

## 2013-12-21 MED ORDER — ESZOPICLONE 3 MG PO TABS: ORAL_TABLET | ORAL | Status: DC

## 2013-12-21 NOTE — Telephone Encounter (Signed)
This has already been approved, see other encounter

## 2013-12-21 NOTE — Telephone Encounter (Signed)
Sent mychart, needs refills

## 2013-12-31 ENCOUNTER — Encounter: Payer: Self-pay | Admitting: Internal Medicine

## 2013-12-31 ENCOUNTER — Ambulatory Visit (INDEPENDENT_AMBULATORY_CARE_PROVIDER_SITE_OTHER): Payer: Medicare Other | Admitting: Internal Medicine

## 2013-12-31 VITALS — BP 142/82 | HR 49 | Temp 98.2°F | Ht 63.0 in | Wt 131.2 lb

## 2013-12-31 DIAGNOSIS — I10 Essential (primary) hypertension: Secondary | ICD-10-CM | POA: Diagnosis not present

## 2013-12-31 DIAGNOSIS — R1013 Epigastric pain: Secondary | ICD-10-CM

## 2013-12-31 DIAGNOSIS — R200 Anesthesia of skin: Secondary | ICD-10-CM

## 2013-12-31 DIAGNOSIS — R209 Unspecified disturbances of skin sensation: Secondary | ICD-10-CM

## 2013-12-31 DIAGNOSIS — L299 Pruritus, unspecified: Secondary | ICD-10-CM

## 2013-12-31 DIAGNOSIS — R252 Cramp and spasm: Secondary | ICD-10-CM

## 2013-12-31 LAB — CBC WITH DIFFERENTIAL/PLATELET
Basophils Absolute: 0 10*3/uL (ref 0.0–0.1)
Basophils Relative: 0.4 % (ref 0.0–3.0)
Eosinophils Absolute: 0.1 10*3/uL (ref 0.0–0.7)
Eosinophils Relative: 1.1 % (ref 0.0–5.0)
HEMATOCRIT: 44.9 % (ref 39.0–52.0)
Hemoglobin: 15.2 g/dL (ref 13.0–17.0)
LYMPHS ABS: 1.1 10*3/uL (ref 0.7–4.0)
LYMPHS PCT: 11 % — AB (ref 12.0–46.0)
MCHC: 33.9 g/dL (ref 30.0–36.0)
MCV: 96.7 fl (ref 78.0–100.0)
Monocytes Absolute: 0.5 10*3/uL (ref 0.1–1.0)
Monocytes Relative: 4.4 % (ref 3.0–12.0)
Neutro Abs: 8.7 10*3/uL — ABNORMAL HIGH (ref 1.4–7.7)
Neutrophils Relative %: 83.1 % — ABNORMAL HIGH (ref 43.0–77.0)
PLATELETS: 210 10*3/uL (ref 150.0–400.0)
RBC: 4.65 Mil/uL (ref 4.22–5.81)
RDW: 13.7 % (ref 11.5–15.5)
WBC: 10.4 10*3/uL (ref 4.0–10.5)

## 2013-12-31 LAB — COMPREHENSIVE METABOLIC PANEL
ALBUMIN: 4.2 g/dL (ref 3.5–5.2)
ALT: 26 U/L (ref 0–53)
AST: 19 U/L (ref 0–37)
Alkaline Phosphatase: 93 U/L (ref 39–117)
BUN: 24 mg/dL — AB (ref 6–23)
CHLORIDE: 103 meq/L (ref 96–112)
CO2: 27 mEq/L (ref 19–32)
Calcium: 9.4 mg/dL (ref 8.4–10.5)
Creatinine, Ser: 0.8 mg/dL (ref 0.4–1.5)
GFR: 95.01 mL/min (ref >60.00)
GLUCOSE: 110 mg/dL — AB (ref 70–99)
Potassium: 4.9 mEq/L (ref 3.5–5.1)
Sodium: 139 mEq/L (ref 135–145)
Total Bilirubin: 0.5 mg/dL (ref 0.2–1.2)
Total Protein: 7.1 g/dL (ref 6.0–8.3)

## 2013-12-31 LAB — VITAMIN B12: VITAMIN B 12: 305 pg/mL (ref 211–911)

## 2013-12-31 LAB — MAGNESIUM: MAGNESIUM: 2 mg/dL (ref 1.5–2.5)

## 2013-12-31 MED ORDER — PANTOPRAZOLE SODIUM 40 MG PO TBEC: 40.0000 mg | DELAYED_RELEASE_TABLET | Freq: Every day | ORAL | Status: DC

## 2013-12-31 NOTE — Progress Notes (Signed)
Pre visit review using our clinic review tool, if applicable. No additional management support is needed unless otherwise documented below in the visit note. 

## 2013-12-31 NOTE — Assessment & Plan Note (Signed)
Axillary pruritis likely triggered by allergen exposure with deodorant. Will have him hold deodorant for a few days or try natural formula. Follow up 2 weeks.

## 2013-12-31 NOTE — Assessment & Plan Note (Signed)
Symptoms most consistent with GERD/gastritis. Encouraged him to hold Meloxicam. Will start Pantoprazole 40mg  daily. Follow up in 1-2 weeks. If no improvement, consider GI evaluation for upper endoscopy.

## 2013-12-31 NOTE — Progress Notes (Addendum)
Subjective:    Patient ID: Seth Baxter, male    DOB: [birth year], 73 y.o.   MRN: [id number]  HPI 73YO male presents for follow up.  Epigastric pain - 3-4 months intermittent burning pain. Every "once in a while" without a clear trigger. Food does not seem to have an effect. No nausea, vomiting. No change in BM.  Leg Cramps - calf of right leg. Worse at night. Has not taken anything for this.  Itching - Under armpits. 6-8 months. ? Related to deodorant. No change in detergent. No rash or other skin lesions noted.   Also concerned about intermittent left leg numbness. Occurs typically when running. Not presently having numbness. No weakness. Has h/o lumbar spinal stenosis, but has not recently been seen by orthopedics.  HTN - BP generally well controlled 130s/80s, except for occasional readings 160/80s. Compliant with medication.  Review of Systems  Constitutional: Negative for fever, chills, activity change, appetite change, fatigue and unexpected weight change.  Eyes: Negative for visual disturbance.  Respiratory: Negative for cough and shortness of breath.   Cardiovascular: Negative for chest pain, palpitations and leg swelling.  Gastrointestinal: Positive for abdominal pain (epigastric area). Negative for abdominal distention.  Genitourinary: Negative for dysuria, urgency and difficulty urinating.  Musculoskeletal: Positive for myalgias. Negative for arthralgias and gait problem.  Skin: Negative for color change and rash.  Hematological: Negative for adenopathy.  Psychiatric/Behavioral: Negative for sleep disturbance and dysphoric mood. The patient is not nervous/anxious.        Objective:    BP 142/82  Pulse 49  Temp(Src) 98.2 F (36.8 C) (Oral)  Ht $R'5\' 3"'mY$  (1.6 m)  Wt 131 lb 4 oz (59.535 kg)  BMI 23.26 kg/m2  SpO2 95% Physical Exam  Constitutional: He is oriented to person, place, and time. He appears well-developed and well-nourished. No distress.  HENT:  Head:  Normocephalic and atraumatic.  Right Ear: External ear normal.  Left Ear: External ear normal.  Nose: Nose normal.  Mouth/Throat: Oropharynx is clear and moist. No oropharyngeal exudate.  Eyes: Conjunctivae and EOM are normal. Pupils are equal, round, and reactive to light. Right eye exhibits no discharge. Left eye exhibits no discharge. No scleral icterus.  Neck: Normal range of motion. Neck supple. No tracheal deviation present. No thyromegaly present.  Cardiovascular: Normal rate, regular rhythm and normal heart sounds.  Exam reveals no gallop and no friction rub.   No murmur heard. Pulmonary/Chest: Effort normal and breath sounds normal. No accessory muscle usage. Not tachypneic. No respiratory distress. He has no decreased breath sounds. He has no wheezes. He has no rhonchi. He has no rales. He exhibits no tenderness.  Abdominal: Soft. Bowel sounds are normal. He exhibits no distension. There is tenderness (mild, epigastric area).  Musculoskeletal: Normal range of motion. He exhibits no edema.  Lymphadenopathy:    He has no cervical adenopathy.  Neurological: He is alert and oriented to person, place, and time. He displays no atrophy. No cranial nerve deficit or sensory deficit. He exhibits normal muscle tone. Coordination and gait normal.  Skin: Skin is warm and dry. No rash noted. He is not diaphoretic. No erythema. No pallor.  Psychiatric: He has a normal mood and affect. His behavior is normal. Judgment and thought content normal.          Assessment & Plan:   Problem List Items Addressed This Visit     Unprioritized   Abdominal pain, epigastric     Symptoms most consistent with GERD/gastritis. Encouraged  him to hold Meloxicam. Will start Pantoprazole 40mg  daily. Follow up in 1-2 weeks. If no improvement, consider GI evaluation for upper endoscopy.    Relevant Medications      pantoprazole (PROTONIX) EC tablet   Hypertension - Primary      BP Readings from Last 3  Encounters:  12/31/13 142/82  11/01/13 144/86  10/01/13 142/75   BP generally well controlled. Continue current medications. Will check renal function with labs today.    Left leg numbness     Left leg numbness intermittently concerning for lumbar radiculopathy. Reviewed MRI lumbar spine from 2010. Given progression of symptoms, will repeat MRI. Follow up 2 weeks.    Relevant Orders      MR Lumbar Spine Wo Contrast      CBC with Differential   Leg cramps     Intermittent nocturnal leg cramps. Will check electrolytes with labs today.    Relevant Orders      Comp Met (CMET)      Magnesium      B12   Pruritus     Axillary pruritis likely triggered by allergen exposure with deodorant. Will have him hold deodorant for a few days or try natural formula. Follow up 2 weeks.        Return in about 4 weeks (around 01/28/2014) for Recheck.

## 2013-12-31 NOTE — Assessment & Plan Note (Signed)
Intermittent nocturnal leg cramps. Will check electrolytes with labs today.

## 2013-12-31 NOTE — Assessment & Plan Note (Signed)
BP Readings from Last 3 Encounters:  12/31/13 142/82  11/01/13 144/86  10/01/13 142/75   BP generally well controlled. Continue current medications. Will check renal function with labs today.

## 2013-12-31 NOTE — Assessment & Plan Note (Signed)
Left leg numbness intermittently concerning for lumbar radiculopathy. Reviewed MRI lumbar spine from 2010. Given progression of symptoms, will repeat MRI. Follow up 2 weeks.

## 2014-01-01 ENCOUNTER — Telehealth: Payer: Self-pay | Admitting: Internal Medicine

## 2014-01-01 NOTE — Telephone Encounter (Signed)
Relevant patient education assigned to patient using Emmi. ° °

## 2014-01-03 ENCOUNTER — Encounter: Payer: Self-pay | Admitting: Internal Medicine

## 2014-01-03 ENCOUNTER — Other Ambulatory Visit: Payer: Self-pay | Admitting: Internal Medicine

## 2014-01-16 ENCOUNTER — Encounter: Payer: Self-pay | Admitting: Internal Medicine

## 2014-01-16 ENCOUNTER — Other Ambulatory Visit: Payer: Self-pay | Admitting: Internal Medicine

## 2014-01-17 ENCOUNTER — Other Ambulatory Visit: Payer: Self-pay | Admitting: Internal Medicine

## 2014-02-05 ENCOUNTER — Ambulatory Visit: Payer: Medicare Other | Admitting: Internal Medicine

## 2014-03-13 ENCOUNTER — Other Ambulatory Visit: Payer: Self-pay | Admitting: Internal Medicine

## 2014-03-13 NOTE — Telephone Encounter (Signed)
Ok refill? 

## 2014-04-10 ENCOUNTER — Ambulatory Visit: Payer: Medicare Other | Admitting: Internal Medicine

## 2014-05-27 ENCOUNTER — Other Ambulatory Visit: Payer: Self-pay | Admitting: Internal Medicine

## 2014-06-04 ENCOUNTER — Ambulatory Visit (INDEPENDENT_AMBULATORY_CARE_PROVIDER_SITE_OTHER): Payer: Medicare Other | Admitting: Internal Medicine

## 2014-06-04 ENCOUNTER — Telehealth: Payer: Self-pay | Admitting: Internal Medicine

## 2014-06-04 ENCOUNTER — Observation Stay (HOSPITAL_COMMUNITY)
Admission: EM | Admit: 2014-06-04 | Discharge: 2014-06-05 | Disposition: A | Discharge status: Home / Self Care | Payer: Medicare Other | Attending: Family Medicine | Admitting: Family Medicine

## 2014-06-04 ENCOUNTER — Observation Stay (HOSPITAL_COMMUNITY): Payer: Medicare Other

## 2014-06-04 ENCOUNTER — Encounter: Payer: Self-pay | Admitting: Internal Medicine

## 2014-06-04 ENCOUNTER — Encounter (HOSPITAL_COMMUNITY): Payer: Self-pay

## 2014-06-04 VITALS — BP 118/78 | HR 47 | Temp 98.2°F | Ht 63.0 in | Wt 131.2 lb

## 2014-06-04 DIAGNOSIS — I10 Essential (primary) hypertension: Secondary | ICD-10-CM | POA: Diagnosis not present

## 2014-06-04 DIAGNOSIS — S6991XA Unspecified injury of right wrist, hand and finger(s), initial encounter: Secondary | ICD-10-CM | POA: Diagnosis not present

## 2014-06-04 DIAGNOSIS — Z87891 Personal history of nicotine dependence: Secondary | ICD-10-CM | POA: Insufficient documentation

## 2014-06-04 DIAGNOSIS — W540XXA Bitten by dog, initial encounter: Secondary | ICD-10-CM | POA: Insufficient documentation

## 2014-06-04 DIAGNOSIS — Z791 Long term (current) use of non-steroidal anti-inflammatories (NSAID): Secondary | ICD-10-CM | POA: Insufficient documentation

## 2014-06-04 DIAGNOSIS — S61259A Open bite of unspecified finger without damage to nail, initial encounter: Secondary | ICD-10-CM | POA: Diagnosis not present

## 2014-06-04 DIAGNOSIS — L089 Local infection of the skin and subcutaneous tissue, unspecified: Secondary | ICD-10-CM | POA: Diagnosis not present

## 2014-06-04 DIAGNOSIS — R001 Bradycardia, unspecified: Secondary | ICD-10-CM | POA: Diagnosis not present

## 2014-06-04 DIAGNOSIS — S61431A Puncture wound without foreign body of right hand, initial encounter: Secondary | ICD-10-CM

## 2014-06-04 DIAGNOSIS — L0390 Cellulitis, unspecified: Secondary | ICD-10-CM | POA: Diagnosis present

## 2014-06-04 DIAGNOSIS — S61031A Puncture wound without foreign body of right thumb without damage to nail, initial encounter: Secondary | ICD-10-CM | POA: Diagnosis not present

## 2014-06-04 DIAGNOSIS — K219 Gastro-esophageal reflux disease without esophagitis: Secondary | ICD-10-CM | POA: Diagnosis not present

## 2014-06-04 DIAGNOSIS — Z23 Encounter for immunization: Secondary | ICD-10-CM | POA: Diagnosis not present

## 2014-06-04 DIAGNOSIS — E785 Hyperlipidemia, unspecified: Secondary | ICD-10-CM | POA: Diagnosis not present

## 2014-06-04 DIAGNOSIS — I891 Lymphangitis: Secondary | ICD-10-CM | POA: Insufficient documentation

## 2014-06-04 DIAGNOSIS — T148XXA Other injury of unspecified body region, initial encounter: Secondary | ICD-10-CM

## 2014-06-04 DIAGNOSIS — L03011 Cellulitis of right finger: Secondary | ICD-10-CM

## 2014-06-04 DIAGNOSIS — S61451A Open bite of right hand, initial encounter: Secondary | ICD-10-CM

## 2014-06-04 DIAGNOSIS — M1990 Unspecified osteoarthritis, unspecified site: Secondary | ICD-10-CM | POA: Diagnosis not present

## 2014-06-04 HISTORY — DX: Coagulation defect, unspecified: D68.9

## 2014-06-04 HISTORY — DX: Pneumonia, unspecified organism: J18.9

## 2014-06-04 LAB — CBC WITH DIFFERENTIAL/PLATELET
Basophils Absolute: 0 10*3/uL (ref 0.0–0.1)
Basophils Relative: 0 % (ref 0–1)
EOS ABS: 0.1 10*3/uL (ref 0.0–0.7)
Eosinophils Relative: 1 % (ref 0–5)
HCT: 42.1 % (ref 39.0–52.0)
Hemoglobin: 14.7 g/dL (ref 13.0–17.0)
Lymphocytes Relative: 9 % — ABNORMAL LOW (ref 12–46)
Lymphs Abs: 1.1 10*3/uL (ref 0.7–4.0)
MCH: 33.6 pg (ref 26.0–34.0)
MCHC: 34.9 g/dL (ref 30.0–36.0)
MCV: 96.1 fL (ref 78.0–100.0)
Monocytes Absolute: 0.8 10*3/uL (ref 0.1–1.0)
Monocytes Relative: 7 % (ref 3–12)
NEUTROS ABS: 9.8 10*3/uL — AB (ref 1.7–7.7)
NEUTROS PCT: 83 % — AB (ref 43–77)
PLATELETS: 180 10*3/uL (ref 150–400)
RBC: 4.38 MIL/uL (ref 4.22–5.81)
RDW: 12.9 % (ref 11.5–15.5)
WBC: 11.7 10*3/uL — AB (ref 4.0–10.5)

## 2014-06-04 LAB — BASIC METABOLIC PANEL
Anion gap: 12 (ref 5–15)
BUN: 24 mg/dL — ABNORMAL HIGH (ref 6–23)
CO2: 27 meq/L (ref 19–32)
Calcium: 9.1 mg/dL (ref 8.4–10.5)
Chloride: 98 mEq/L (ref 96–112)
Creatinine, Ser: 0.73 mg/dL (ref 0.50–1.35)
GFR calc Af Amer: >90 mL/min (ref >90)
GFR, EST NON AFRICAN AMERICAN: 89 mL/min — AB (ref >90)
GLUCOSE: 112 mg/dL — AB (ref 70–99)
POTASSIUM: 4 meq/L (ref 3.7–5.3)
SODIUM: 137 meq/L (ref 137–147)

## 2014-06-04 MED ORDER — VITAMIN D3 25 MCG (1000 UNIT) PO TABS
1000.0000 [IU] | ORAL_TABLET | Freq: Every day | ORAL | Status: DC
  Filled 2014-06-04: qty 1

## 2014-06-04 MED ORDER — ACETAMINOPHEN 650 MG RE SUPP: 650.0000 mg | Freq: Four times a day (QID) | RECTAL | Status: DC | PRN

## 2014-06-04 MED ORDER — SODIUM CHLORIDE 0.9 % IJ SOLN: 3.0000 mL | INTRAMUSCULAR | Status: DC | PRN

## 2014-06-04 MED ORDER — SODIUM CHLORIDE 0.9 % IV SOLN: 250.0000 mL | INTRAVENOUS | Status: DC | PRN

## 2014-06-04 MED ORDER — ROSUVASTATIN CALCIUM 5 MG PO TABS
5.0000 mg | ORAL_TABLET | Freq: Every day | ORAL | Status: DC
  Filled 2014-06-04: qty 1

## 2014-06-04 MED ORDER — CLINDAMYCIN PHOSPHATE 900 MG/50ML IV SOLN
900.0000 mg | Freq: Once | INTRAVENOUS | Status: AC
  Administered 2014-06-04: 900 mg via INTRAVENOUS
  Filled 2014-06-04: qty 50

## 2014-06-04 MED ORDER — ZOLPIDEM TARTRATE 5 MG PO TABS: 5.0000 mg | ORAL_TABLET | Freq: Every evening | ORAL | Status: DC | PRN

## 2014-06-04 MED ORDER — FAMOTIDINE 20 MG PO TABS
20.0000 mg | ORAL_TABLET | Freq: Two times a day (BID) | ORAL | Status: DC
  Administered 2014-06-04: 20 mg via ORAL
  Filled 2014-06-04 (×3): qty 1

## 2014-06-04 MED ORDER — HEPARIN SODIUM (PORCINE) 5000 UNIT/ML IJ SOLN
5000.0000 [IU] | Freq: Three times a day (TID) | INTRAMUSCULAR | Status: DC
  Filled 2014-06-04 (×4): qty 1

## 2014-06-04 MED ORDER — CLINDAMYCIN PHOSPHATE 600 MG/50ML IV SOLN
600.0000 mg | Freq: Three times a day (TID) | INTRAVENOUS | Status: DC
  Administered 2014-06-05: 600 mg via INTRAVENOUS
  Filled 2014-06-04 (×3): qty 50

## 2014-06-04 MED ORDER — TRAMADOL HCL 50 MG PO TABS
50.0000 mg | ORAL_TABLET | Freq: Two times a day (BID) | ORAL | Status: DC | PRN
Start: 2014-06-04 — End: 2014-06-05

## 2014-06-04 MED ORDER — AMLODIPINE BESYLATE 10 MG PO TABS
10.0000 mg | ORAL_TABLET | Freq: Every day | ORAL | Status: DC
  Filled 2014-06-04: qty 1

## 2014-06-04 MED ORDER — QUINAPRIL HCL 10 MG PO TABS
40.0000 mg | ORAL_TABLET | Freq: Every day | ORAL | Status: DC
  Filled 2014-06-04: qty 4

## 2014-06-04 MED ORDER — ACETAMINOPHEN 325 MG PO TABS: 650.0000 mg | ORAL_TABLET | Freq: Four times a day (QID) | ORAL | Status: DC | PRN

## 2014-06-04 MED ORDER — SODIUM CHLORIDE 0.9 % IV SOLN
INTRAVENOUS | Status: AC
  Administered 2014-06-04: 19:00:00 via INTRAVENOUS

## 2014-06-04 MED ORDER — SODIUM CHLORIDE 0.9 % IJ SOLN: 3.0000 mL | Freq: Two times a day (BID) | INTRAMUSCULAR | Status: DC

## 2014-06-04 NOTE — Addendum Note (Signed)
Addended by: Vernetta Honey on: 06/04/2014 03:33 PM   Modules accepted: Orders

## 2014-06-04 NOTE — ED Notes (Signed)
Pt was bitten by his dog yesterday to his right thumb. Noticed a red streak up his arm and swelling to the thumb today.

## 2014-06-04 NOTE — Progress Notes (Signed)
   Subjective:    Patient ID: Seth Baxter, male    DOB: 08-13-1940, 73 y.o.   MRN: [id number]  HPI 72YO male presents for acute visit.  Dog bite - bit by dog on right thumb yesterday morning about 10am. Felt feverish this morning. Notes purulent drainage from puncture. Redness now spreading up right arm into axilla with swelling in axilla.  Review of Systems  Constitutional: Positive for fever and chills. Negative for activity change, appetite change, fatigue and unexpected weight change.  Eyes: Negative for visual disturbance.  Respiratory: Negative for cough and shortness of breath.   Cardiovascular: Negative for chest pain, palpitations and leg swelling.  Gastrointestinal: Negative for abdominal pain and abdominal distention.  Genitourinary: Negative for dysuria, urgency and difficulty urinating.  Musculoskeletal: Negative for arthralgias and gait problem.  Skin: Positive for color change and wound. Negative for rash.  Hematological: Negative for adenopathy.  Psychiatric/Behavioral: Negative for sleep disturbance and dysphoric mood. The patient is not nervous/anxious.        Objective:    BP 118/78 mmHg  Pulse 47  Temp(Src) 98.2 F (36.8 C) (Oral)  Ht 5\' 3"  (1.6 m)  Wt 131 lb 4 oz (59.535 kg)  BMI 23.26 kg/m2  SpO2 97% Physical Exam  Constitutional: He is oriented to person, place, and time. He appears well-developed and well-nourished. No distress.  HENT:  Head: Normocephalic and atraumatic.  Eyes: EOM are normal. Pupils are equal, round, and reactive to light.  Neck: Normal range of motion.  Pulmonary/Chest: Effort normal.  Musculoskeletal: He exhibits edema and tenderness.       Right hand: He exhibits decreased range of motion, tenderness and bony tenderness.       Hands: Neurological: He is alert and oriented to person, place, and time. He has normal reflexes.  Skin: He is not diaphoretic. There is erythema.  Psychiatric: He has a normal mood and affect. His  behavior is normal. Judgment and thought content normal.          Assessment & Plan:   Problem List Items Addressed This Visit      Unprioritized   Dog bite of right hand including fingers with infection - Primary    Dog bite right thumb with puncture wound with purulent drainage and induration spreading up right arm to axilla. Will have pt go to ED for evaluation and IV antibiotics. Called ED to make them aware of patient. Pt is in agreement with plan.        No Follow-up on file.

## 2014-06-04 NOTE — ED Notes (Signed)
Attempted to get report. 
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
## 2014-06-04 NOTE — Assessment & Plan Note (Signed)
Dog bite right thumb with puncture wound with purulent drainage and induration spreading up right arm to axilla. Will have pt go to ED for evaluation and IV antibiotics. Called ED to make them aware of patient. Pt is in agreement with plan.

## 2014-06-04 NOTE — ED Notes (Signed)
Pt st's was bitten on right thumb yesterday by his own dog which is up to date on it's shots.  Pt has puncture wounds to right thumb with redness and red streak up to right arm pit.  Pt c/o pain in arm pit.

## 2014-06-04 NOTE — Telephone Encounter (Signed)
Mr. Current called saying his dog bit him on his right arm. He believes it's infected. He said he has a red line going up his right arm and into his armpit. He's wondering if he can be seen asap. Please call the patient. Pt ph# [phone] Thank you.

## 2014-06-04 NOTE — Telephone Encounter (Signed)
Pt scheduled for today at 2:30

## 2014-06-04 NOTE — ED Notes (Addendum)
Dr. Campos at bedside   
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
## 2014-06-04 NOTE — Patient Instructions (Signed)
To ER for further evaluation with hand surgery and IV antibiotics.

## 2014-06-04 NOTE — Progress Notes (Signed)
Pre visit review using our clinic review tool, if applicable. No additional management support is needed unless otherwise documented below in the visit note. 

## 2014-06-05 ENCOUNTER — Encounter (HOSPITAL_COMMUNITY): Payer: Self-pay | Admitting: *Deleted

## 2014-06-05 ENCOUNTER — Telehealth: Payer: Self-pay | Admitting: Internal Medicine

## 2014-06-05 DIAGNOSIS — S61031A Puncture wound without foreign body of right thumb without damage to nail, initial encounter: Secondary | ICD-10-CM | POA: Diagnosis not present

## 2014-06-05 DIAGNOSIS — S61259A Open bite of unspecified finger without damage to nail, initial encounter: Secondary | ICD-10-CM | POA: Diagnosis not present

## 2014-06-05 DIAGNOSIS — L089 Local infection of the skin and subcutaneous tissue, unspecified: Secondary | ICD-10-CM | POA: Diagnosis not present

## 2014-06-05 DIAGNOSIS — W540XXA Bitten by dog, initial encounter: Secondary | ICD-10-CM

## 2014-06-05 DIAGNOSIS — L03011 Cellulitis of right finger: Secondary | ICD-10-CM | POA: Diagnosis not present

## 2014-06-05 DIAGNOSIS — S61431A Puncture wound without foreign body of right hand, initial encounter: Secondary | ICD-10-CM | POA: Diagnosis not present

## 2014-06-05 LAB — CBC
HCT: 40.7 % (ref 39.0–52.0)
Hemoglobin: 14.1 g/dL (ref 13.0–17.0)
MCH: 32.8 pg (ref 26.0–34.0)
MCHC: 34.6 g/dL (ref 30.0–36.0)
MCV: 94.7 fL (ref 78.0–100.0)
PLATELETS: 180 10*3/uL (ref 150–400)
RBC: 4.3 MIL/uL (ref 4.22–5.81)
RDW: 13.1 % (ref 11.5–15.5)
WBC: 6.8 10*3/uL (ref 4.0–10.5)

## 2014-06-05 MED ORDER — CLINDAMYCIN HCL 300 MG PO CAPS: 300.0000 mg | ORAL_CAPSULE | Freq: Three times a day (TID) | ORAL | Status: DC

## 2014-06-05 MED ORDER — CLINDAMYCIN HCL 300 MG PO CAPS
300.0000 mg | ORAL_CAPSULE | Freq: Three times a day (TID) | ORAL | Status: DC
  Administered 2014-06-05: 300 mg via ORAL
  Filled 2014-06-05 (×4): qty 1

## 2014-06-05 MED ORDER — BACID PO TABS: 2.0000 | ORAL_TABLET | Freq: Three times a day (TID) | ORAL | Status: DC

## 2014-06-05 MED ORDER — BACID PO TABS
2.0000 | ORAL_TABLET | Freq: Three times a day (TID) | ORAL | Status: DC
  Administered 2014-06-05: 2 via ORAL
  Filled 2014-06-05 (×3): qty 2

## 2014-06-05 NOTE — Care Management Note (Signed)
    Page 1 of 1   06/05/2014     10:54:33 AM CARE MANAGEMENT NOTE 06/05/2014  Patient:  Seth Baxter, Seth Baxter   Account Number:  [id number]  Date Initiated:  06/05/2014  Documentation initiated by:  Tomi Bamberger  Subjective/Objective Assessment:   dx hand injury  admit as observation- lives with spouse.     Action/Plan:   Anticipated DC Date:  06/05/2014   Anticipated DC Plan:  [place]  CM consult      Choice offered to / List presented to:             Status of service:  Completed, signed off Medicare Important Message given?  NO (If response is "NO", the following Medicare IM given date fields will be blank) Date Medicare IM given:   Medicare IM given by:   Date Additional Medicare IM given:   Additional Medicare IM given by:    Discharge Disposition:  HOME/SELF CARE  Per UR Regulation:  Reviewed for med. necessity/level of care/duration of stay  If discussed at [place] of Stay Meetings, dates discussed:    Comments:  06/05/14 [place], BSN [phone] no needs anticipated.

## 2014-06-05 NOTE — Progress Notes (Signed)
Joya Martyr to be D/C'd Home per MD order.  Discussed with the patient and all questions fully answered.    Medication List    STOP taking these medications        BYSTOLIC 10 MG tablet  Generic drug:  nebivolol     FIORINAL/CODEINE #3 50-325-40-30 MG capsule  Generic drug:  butalbital-aspirin-caffeine-codeine     nebivolol 10 MG tablet  Commonly known as:  BYSTOLIC      TAKE these medications        amLODipine 10 MG tablet  Commonly known as:  NORVASC  TAKE 1 TABLET DAILY     aspirin-acetaminophen-caffeine 250-250-65 MG per tablet  Commonly known as:  EXCEDRIN MIGRAINE  Take 1 tablet by mouth 2 (two) times daily as needed for headache.     butalbital-aspirin-caffeine 50-325-40 MG per capsule  Commonly known as:  FIORINAL  take 1 capsule by mouth twice a day if needed     chlorpheniramine 4 MG tablet  Commonly known as:  CHLOR-TRIMETON  Take 4 mg by mouth as needed for allergies.     cholecalciferol 1000 UNITS tablet  Commonly known as:  VITAMIN D  Take 1,000 Units by mouth daily.     clindamycin 300 MG capsule  Commonly known as:  CLEOCIN  Take 1 capsule (300 mg total) by mouth every 8 (eight) hours.     Eszopiclone 3 MG Tabs  TAKE 1 TABLET(S) BY MOUTH EVERY NIGHT AT BEDTIME     lactobacillus acidophilus Tabs tablet  Take 2 tablets by mouth 3 (three) times daily.     meloxicam 15 MG tablet  Commonly known as:  MOBIC  Take 15 mg by mouth daily.     quinapril 40 MG tablet  Commonly known as:  ACCUPRIL  TAKE 1 TABLET DAILY     ranitidine 150 MG tablet  Commonly known as:  ZANTAC  Take 150 mg by mouth 2 (two) times daily.     rosuvastatin 5 MG tablet  Commonly known as:  CRESTOR  Take 5 mg by mouth daily.     sildenafil 50 MG tablet  Commonly known as:  VIAGRA  Take 1 tablet (50 mg total) by mouth daily as needed for erectile dysfunction.     traMADol 50 MG tablet  Commonly known as:  ULTRAM  Take 50 mg by mouth as needed.     TYLENOL  ARTHRITIS PAIN 650 MG CR tablet  Generic drug:  acetaminophen  Take 650 mg by mouth 3 (three) times daily.        VVS, Skin clean, dry and intact without evidence of skin break down, no evidence of skin tears noted. IV catheter discontinued intact. Site without signs and symptoms of complications. Dressing and pressure applied.  An After Visit Summary was printed and given to the patient.  D/c education completed with patient/family including follow up instructions, medication list, d/c activities limitations if indicated, with other d/c instructions as indicated by MD - patient able to verbalize understanding, all questions fully answered.   Patient instructed to return to ED, call 911, or call MD for any changes in condition.   Patient escorted via [place], and D/C home via private auto.  Lukis Bunt D 06/05/2014 11:28 AM

## 2014-06-05 NOTE — Telephone Encounter (Signed)
Pt needs HFU for dog bite, d/c today. Hospital summary in Dr. Thomes Dinning box. Please advise/msn

## 2014-06-05 NOTE — Discharge Instructions (Signed)
You were admitted for a skin infection resulting from a dog bite.  You are being treated with Clindamycin.  You will need to finish a 10 day course of this at home.  Please take all of the antibiotics as prescribed until you finish all of the pills, even if you are feeling better.    Animal Bite An animal bite can result in a scratch on the skin, deep open cut, puncture of the skin, crush injury, or tearing away of the skin or a body part. Dogs are responsible for most animal bites. Children are bitten more often than adults. An animal bite can range from very mild to more serious. A small bite from your house pet is no cause for alarm. However, some animal bites can become infected or injure a bone or other tissue. You must seek medical care if:  The skin is broken and bleeding does not slow down or stop after 15 minutes.  The puncture is deep and difficult to clean (such as a cat bite).  Pain, warmth, redness, or pus develops around the wound.  The bite is from a stray animal or rodent. There may be a risk of rabies infection.  The bite is from a snake, raccoon, skunk, fox, coyote, or bat. There may be a risk of rabies infection.  The person bitten has a chronic illness such as diabetes, liver disease, or cancer, or the person takes medicine that lowers the immune system.  There is concern about the location and severity of the bite. It is important to clean and protect an animal bite wound right away to prevent infection. Follow these steps:  Clean the wound with plenty of water and soap.  Apply an antibiotic cream.  Apply gentle pressure over the wound with a clean towel or gauze to slow or stop bleeding.  Elevate the affected area above the heart to help stop any bleeding.  Seek medical care. Getting medical care within 8 hours of the animal bite leads to the best possible outcome. DIAGNOSIS  Your caregiver will most likely:  Take a detailed history of the animal and the bite  injury.  Perform a wound exam.  Take your medical history. Blood tests or X-rays may be performed. Sometimes, infected bite wounds are cultured and sent to a lab to identify the infectious bacteria.  TREATMENT  Medical treatment will depend on the location and type of animal bite as well as the patient's medical history. Treatment may include:  Wound care, such as cleaning and flushing the wound with saline solution, bandaging, and elevating the affected area.  Antibiotics.  Tetanus immunization.  Rabies immunization.  Leaving the wound open to heal. This is often done with animal bites, due to the high risk of infection. However, in certain cases, wound closure with stitches, wound adhesive, skin adhesive strips, or staples may be used. Infected bites that are left untreated may require intravenous (IV) antibiotics and surgical treatment in the hospital. [place]  Follow your caregiver's instructions for wound care.  Take all medicines as directed.  If your caregiver prescribes antibiotics, take them as directed. Finish them even if you start to feel better.  Follow up with your caregiver for further exams or immunizations as directed. You may need a tetanus shot if:  You cannot remember when you had your last tetanus shot.  You have never had a tetanus shot.  The injury broke your skin. If you get a tetanus shot, your arm may swell,  get red, and feel warm to the touch. This is common and not a problem. If you need a tetanus shot and you choose not to have one, there is a rare chance of getting tetanus. Sickness from tetanus can be serious. SEEK MEDICAL CARE IF:  You notice warmth, redness, soreness, swelling, pus discharge, or a bad smell coming from the wound.  You have a red line on the skin coming from the wound.  You have a fever, chills, or a general ill feeling.  You have nausea or vomiting.  You have continued or worsening pain.  You have  trouble moving the injured part.  You have other questions or concerns. MAKE SURE YOU:  Understand these instructions.  Will watch your condition.  Will get help right away if you are not doing well or get worse. Document Released: 04/06/2011 Document Revised: 10/11/2011 Document Reviewed: 04/06/2011 [facility] Patient Information 2015 [place], [place]. This information is not intended to replace advice given to you by your health care provider. Make sure you discuss any questions you have with your health care provider.   Cellulitis Cellulitis is an infection of the skin and the tissue beneath it. The infected area is usually red and tender. Cellulitis occurs most often in the arms and lower legs.  CAUSES  Cellulitis is caused by bacteria that enter the skin through cracks or cuts in the skin. The most common types of bacteria that cause cellulitis are staphylococci and streptococci. SIGNS AND SYMPTOMS   Redness and warmth.  Swelling.  Tenderness or pain.  Fever. DIAGNOSIS  Your health care provider can usually determine what is wrong based on a physical exam. Blood tests may also be done. TREATMENT  Treatment usually involves taking an antibiotic medicine. HOME CARE INSTRUCTIONS   Take your antibiotic medicine as directed by your health care provider. Finish the antibiotic even if you start to feel better.  Keep the infected arm or leg elevated to reduce swelling.  Apply a warm cloth to the affected area up to 4 times per day to relieve pain.  Take medicines only as directed by your health care provider.  Keep all follow-up visits as directed by your health care provider. SEEK MEDICAL CARE IF:   You notice red streaks coming from the infected area.  Your red area gets larger or turns dark in color.  Your bone or joint underneath the infected area becomes painful after the skin has healed.  Your infection returns in the same area or another area.  You notice a  swollen bump in the infected area.  You develop new symptoms.  You have a fever. SEEK IMMEDIATE MEDICAL CARE IF:   You feel very sleepy.  You develop vomiting or diarrhea.  You have a general ill feeling (malaise) with muscle aches and pains. MAKE SURE YOU:   Understand these instructions.  Will watch your condition.  Will get help right away if you are not doing well or get worse. Document Released: 04/28/2005 Document Revised: 12/03/2013 Document Reviewed: 10/04/2011 [facility] Patient Information 2015 [place], [place]. This information is not intended to replace advice given to you by your health care provider. Make sure you discuss any questions you have with your health care provider.

## 2014-06-05 NOTE — Progress Notes (Signed)
Pt arrive to unit in no s/s of distress. VS stable. Whiteboard updated. Callbell within reach. Pt A&Ox4. Pt's wife at bedside. Pt oriented to unit and room. Report received from ED nurse prior to pt's arrival. Will continue to monitor.

## 2014-06-05 NOTE — Plan of Care (Signed)
Problem: Phase II Progression Outcomes Goal: Progress activity as tolerated unless otherwise ordered Outcome: Completed/Met Date Met:  06/05/14 Goal: Temperature < 101 Outcome: Completed/Met Date Met:  06/05/14 Goal: Tolerating diet Outcome: Completed/Met Date Met:  06/05/14  Problem: Phase III Progression Outcomes Goal: Activity at appropriate level-compared to baseline (UP IN CHAIR FOR HEMODIALYSIS)  Outcome: Completed/Met Date Met:  06/05/14 Goal: Temperature < 100 Outcome: Completed/Met Date Met:  06/05/14  Problem: Discharge Progression Outcomes Goal: Pain controlled with appropriate interventions Outcome: Completed/Met Date Met:  06/05/14 Goal: Tolerating diet Outcome: Completed/Met Date Met:  06/05/14 Goal: Wound improving/decreased edema Outcome: Progressing

## 2014-06-05 NOTE — Progress Notes (Signed)
Family Medicine Teaching Service Daily Progress Note Intern Pager: [phone]  Patient name: Seth Baxter Medical record number: [id number] Date of birth: 27-Apr-1941 Age: 73 y.o. Gender: male  Primary Care Provider: Rica Mast, MD Consultants: none Code Status: full code  Pt Overview and Major Events to Date:  11/3 admit to obs under FPTS for cellulitis  Assessment and Plan:   Seth Baxter is a 73 y.o. male presenting with Cellultis (Right thumb extending up RUE) after dog bite . PMH is significant for HTN, HAs, prediabetes, HLD, prostate cancer.  # Cellulitis, Right Thumb with streaking extension up RUE, secondary to dog bite 24 hours ago: Presented due to worsening erythema with extension from R-thumb to R-axilla over 24 hours, otherwise no systemic symptoms, bite seems to be healing without significant laceration or other injury. Initial X-ray in ED negative. Vitals stable, afebrile, no tachycardia. WBC 11.7>6.8. - Transition from IV Clinda 600mg  TID to PO Clinda 300mg  TID - Lactobacillus TID - F/u blood cultures - still pending - Monitor for signs of spread of infection and fever curve - Pain control Tramadol PRN, Acetaminophen PRN pain/fever. Currently controlled w/o significant pain.  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 - Hold home B-blocker (Bystolic)  # GERD: Stable. No current complaints - Continue home Zantac  # OA: Stable, no current complaints - Continue home tramadol 50mg  q12h prn - Hold home Mobic currently while in hospital - will resume at discharge  # HLD - Continue Crestor 5mg  daily (formulary equivalent)  FEN/GI: SLIV, heart healthy diet Prophylaxis: Sub Q Heparin  Disposition: Pending tolerating PO antibiotics, possibly later today  Subjective:  Patient  feeling well this AM. Reports that tenderness, redness, and swelling all decreased since admission.  Objective: Temp:  [97.8 F (36.6 C)-98.3 F (36.8 C)] 98.3 F (36.8 C) (11/04 0639) Pulse Rate:  [45-93] 45 (11/04 0639) Resp:  [16-18] 16 (11/04 0639) BP: (118-152)/(64-78) 149/64 mmHg (11/04 0639) SpO2:  [96 %-100 %] 97 % (11/04 0639) Weight:  [131 lb (59.421 kg)-132 lb (59.875 kg)] 132 lb (59.875 kg) (11/03 2044) Physical Exam: General: well-appearing, pleasant, NAD, sitting in bedside chair HEENT: NCAT, MMM Cardiovascular: RRR, no murmurs heard Respiratory: CTAB. Normal work of breathing. Skin / Extremities: RUE - R thumb two < 1 cm puncture bite marks on dorsal/lateral aspect without extending laceration or open wound, mild localized edema (decreased from admission), no TTP,  No warmth, erythema extending from thumb in distinct linear fashion up inner forearm and upper arm into axilla (less red than on admission). No evidence of abscess or drainage. Right axillary non-tender LAD. Otherwise, skin dry, warm. Peripheral pulses +2 b/l radial / dp Neuro: awake, alert, oriented, grossly non-focal, distal sensation to light touch intact bilateral ext, ROM in R thumb intact.  Laboratory:  Recent Labs Lab 06/04/14 1810 06/05/14 0708  WBC 11.7* 6.8  HGB 14.7 14.1  HCT 42.1 40.7  PLT 180 180    Recent Labs Lab 06/04/14 1810  NA 137  K 4.0  CL 98  CO2 27  BUN 24*  CREATININE 0.73  CALCIUM 9.1  GLUCOSE 112*    Imaging/Diagnostic Tests: X-ray, Right Thumb (11/3):   FINDINGS: There is no evidence of fracture or dislocation. There is no evidence of arthropathy or other focal bone abnormality. Soft tissues are unremarkable. Specifically, no  retained radiopaque foreign body. IMPRESSION:  Negative.   Lavon Paganini, MD 06/05/2014, 9:38 AM PGY-1, [place] Intern pager: [phone], text pages welcome

## 2014-06-05 NOTE — Discharge Summary (Signed)
[facility] Discharge Summary  Patient name: Seth Baxter Medical record number: [id number] Date of birth: [birth year] Age: 73 y.o. Gender: male Date of Admission: 06/04/2014  Date of Discharge: 06/05/14 Admitting Physician: Zigmund Gottron, MD  Primary Care Provider: Rica Mast, MD Consultants: none  Indication for Hospitalization: cellulitis 2/2 dog bite  Discharge Diagnoses/Problem List:  Cellulitis of R thumb 2/2 dog bite HTN Sinus Bradycardia GERD OA HLD  Disposition: Home  Discharge Condition: Stable  Discharge Exam: See progress note from day of discharge  Brief Hospital Course:   Seth Baxter is a 73 y.o. male presenting with Cellultis (Right thumb extending up RUE) after dog bite . PMH is significant for HTN, HAs, prediabetes, HLD, prostate cancer.  # Cellulitis, Right Thumb with streaking extension up RUE, secondary to dog bite 24 hours ago: Presented due to worsening erythema with extension from R-thumb to R-axilla over 24 hours s/p dog bite to R thumb.  No systemic symptoms, bite seems to be healing without significant laceration or other injury. Initial X-ray in ED negative. Vitals stable, afebrile, no tachycardia. WBC 11.7on admission and downtrended to 6.8 before discharge.  Patient treated O/N with IV Clinda 600mg  TID and transitioned to PO Clinda 300mg  TID prior to discharge.  Patient also prescribed Lactobacillus TID  # Sinus Bradycardia: Patient with HR as low as 45, and consistently bradycardic.  Patient reported that this was a chronic issue and that he is an avid runner.  Home B-blocker (Bystolic) was held throughout admission and on discharge.    All other chronic conditions were stable throughout admission and managed with home regimens.   Issues for Follow Up:  1. Follow- up on cellulitis - patient improving on Clindamycin, switched to PO and discharged to finish 10 day course. 2. F/u Bradycardia: patient  with sinus bradycardia as low as 45 during admission. Holding home beta blocker.  Consider Cardiology f/u. 3. F/u blood cultures  Significant Procedures: none  Significant Labs and Imaging:   Recent Labs Lab 06/04/14 1810 06/05/14 0708  WBC 11.7* 6.8  HGB 14.7 14.1  HCT 42.1 40.7  PLT 180 180    Recent Labs Lab 06/04/14 1810  NA 137  K 4.0  CL 98  CO2 27  GLUCOSE 112*  BUN 24*  CREATININE 0.73  CALCIUM 9.1    X-ray, Right Thumb (11/3): FINDINGS: There is no evidence of fracture or dislocation. There is no evidence of arthropathy or other focal bone abnormality. Soft tissues are unremarkable. Specifically, no retained radiopaque foreign body. IMPRESSION: Negative.  Results/Tests Pending at Time of Discharge: Blood cultures 11/3  Discharge Medications:    Medication List    STOP taking these medications        BYSTOLIC 10 MG tablet  Generic drug:  nebivolol     FIORINAL/CODEINE #3 50-325-40-30 MG capsule  Generic drug:  butalbital-aspirin-caffeine-codeine     nebivolol 10 MG tablet  Commonly known as:  BYSTOLIC      TAKE these medications        amLODipine 10 MG tablet  Commonly known as:  NORVASC  TAKE 1 TABLET DAILY     aspirin-acetaminophen-caffeine 250-250-65 MG per tablet  Commonly known as:  EXCEDRIN MIGRAINE  Take 1 tablet by mouth 2 (two) times daily as needed for headache.     butalbital-aspirin-caffeine 50-325-40 MG per capsule  Commonly known as:  FIORINAL  take 1 capsule by mouth twice a day if needed     chlorpheniramine  4 MG tablet  Commonly known as:  CHLOR-TRIMETON  Take 4 mg by mouth as needed for allergies.     cholecalciferol 1000 UNITS tablet  Commonly known as:  VITAMIN D  Take 1,000 Units by mouth daily.     clindamycin 300 MG capsule  Commonly known as:  CLEOCIN  Take 1 capsule (300 mg total) by mouth every 8 (eight) hours.     Eszopiclone 3 MG Tabs  TAKE 1 TABLET(S) BY MOUTH EVERY NIGHT AT BEDTIME      lactobacillus acidophilus Tabs tablet  Take 2 tablets by mouth 3 (three) times daily.     meloxicam 15 MG tablet  Commonly known as:  MOBIC  Take 15 mg by mouth daily.     quinapril 40 MG tablet  Commonly known as:  ACCUPRIL  TAKE 1 TABLET DAILY     ranitidine 150 MG tablet  Commonly known as:  ZANTAC  Take 150 mg by mouth 2 (two) times daily.     rosuvastatin 5 MG tablet  Commonly known as:  CRESTOR  Take 5 mg by mouth daily.     sildenafil 50 MG tablet  Commonly known as:  VIAGRA  Take 1 tablet (50 mg total) by mouth daily as needed for erectile dysfunction.     traMADol 50 MG tablet  Commonly known as:  ULTRAM  Take 50 mg by mouth as needed.     TYLENOL ARTHRITIS PAIN 650 MG CR tablet  Generic drug:  acetaminophen  Take 650 mg by mouth 3 (three) times daily.        Discharge Instructions: Please refer to Patient Instructions section of EMR for full details.  Patient was counseled important signs and symptoms that should prompt return to medical care, changes in medications, dietary instructions, activity restrictions, and follow up appointments.   Follow-Up Appointments: Follow-up Information    Follow up with Rica Mast, MD.   Specialty:  Internal Medicine   Why:  Call to make an appointment for hospital follow-up in about 1 week.   Contact information:   [address] [place] [address] [phone]       Lavon Paganini, MD 06/06/2014, 1:15 PM PGY-1, [place]

## 2014-06-07 ENCOUNTER — Telehealth: Payer: Self-pay | Admitting: *Deleted

## 2014-06-07 NOTE — Telephone Encounter (Signed)
We need to get him in a 64min hospital follow up slot

## 2014-06-07 NOTE — Telephone Encounter (Signed)
Discharge date: 06/05/14  Transition Care Management Follow-up Telephone Call  How have you been since you were released from the hospital? Pt states "fine"    Do you understand why you were in the hospital? YES   Do you understand the discharge instrcutions? YES  Items Reviewed:  Medications reviewed: YES  Allergies reviewed: NO  Dietary changes reviewed: NO  Referrals reviewed: N/A   Functional Questionnaire:   Activities of Daily Living (ADLs):   He states they are independent in the following:  States they require assistance with the following:    Any transportation issues/concerns?: NO   Any patient concerns? NO   Confirmed importance and date/time of follow-up visits scheduled: YES, Nov 11th at 10:30   Confirmed with patient if condition begins to worsen call PCP or go to the ER.  Patient was given the Call-a-Nurse line [phone]: YES

## 2014-06-07 NOTE — Telephone Encounter (Signed)
The patient called this morning asking about when he can be seen by Dr.Walker for his hospital follow up.

## 2014-06-10 LAB — CULTURE, BLOOD (ROUTINE X 2)
CULTURE: NO GROWTH
Culture: NO GROWTH

## 2014-06-12 ENCOUNTER — Encounter: Payer: Self-pay | Admitting: Internal Medicine

## 2014-06-12 ENCOUNTER — Ambulatory Visit (INDEPENDENT_AMBULATORY_CARE_PROVIDER_SITE_OTHER): Payer: Medicare Other | Admitting: Internal Medicine

## 2014-06-12 VITALS — BP 156/82 | HR 45 | Temp 98.2°F | Ht 63.0 in | Wt 129.5 lb

## 2014-06-12 DIAGNOSIS — R252 Cramp and spasm: Secondary | ICD-10-CM

## 2014-06-12 DIAGNOSIS — L03011 Cellulitis of right finger: Secondary | ICD-10-CM

## 2014-06-12 MED ORDER — ESZOPICLONE 3 MG PO TABS: ORAL_TABLET | ORAL | Status: DC

## 2014-06-12 MED ORDER — BUTALBITAL-ASPIRIN-CAFFEINE 50-325-40 MG PO CAPS: ORAL_CAPSULE | ORAL | Status: DC

## 2014-06-12 NOTE — Assessment & Plan Note (Signed)
Chronic leg cramps at night. Reviewed previous labs. Encouraged him to consider adding B12 supplement as level was mildly low. We also discussed potential vascular evaluation, but he would like to hold off for now.

## 2014-06-12 NOTE — Progress Notes (Signed)
Pre visit review using our clinic review tool, if applicable. No additional management support is needed unless otherwise documented below in the visit note. 

## 2014-06-12 NOTE — Progress Notes (Signed)
Subjective:    Patient ID: Seth Baxter, male    DOB: 24-Sep-1940, 73 y.o.   MRN: [id number]  HPI 73YO male presents for hospital follow up.  Admitted : 06/04/2014 Discharged : 11/4//2015 Diagnosis: Dog bite with cellulitis  Admitted at [facility] after dog bite with cellulitis. Treated with IV Clindamycin, then transitioned to oral Clindamycin.  Redness over arm has subsided. Felt hot last night, but no fever. Compliant with Clindamycin.  Continues to have some muscle cramping at night in legs. Previous evaluation including labs showed only mildly low B12. He recently started Caltrate D to help with symptoms.  Review of Systems  Constitutional: Negative for fever, chills, activity change, appetite change, fatigue and unexpected weight change.  Eyes: Negative for visual disturbance.  Respiratory: Negative for cough and shortness of breath.   Cardiovascular: Negative for chest pain, palpitations and leg swelling.  Gastrointestinal: Negative for nausea, abdominal pain, diarrhea, constipation and abdominal distention.  Genitourinary: Negative for dysuria, urgency and difficulty urinating.  Musculoskeletal: Positive for myalgias. Negative for arthralgias and gait problem.  Skin: Positive for wound. Negative for color change and rash.  Neurological: Positive for headaches.  Hematological: Negative for adenopathy.  Psychiatric/Behavioral: Positive for sleep disturbance. Negative for dysphoric mood. The patient is not nervous/anxious.        Objective:    BP 156/82 mmHg  Pulse 45  Temp(Src) 98.2 F (36.8 C) (Oral)  Ht 5\' 3"  (1.6 m)  Wt 129 lb 8 oz (58.741 kg)  BMI 22.95 kg/m2  SpO2 96% Physical Exam  Constitutional: He is oriented to person, place, and time. He appears well-developed and well-nourished. No distress.  HENT:  Head: Normocephalic and atraumatic.  Right Ear: External ear normal.  Left Ear: External ear normal.  Nose: Nose normal.  Mouth/Throat: Oropharynx is clear  and moist. No oropharyngeal exudate.  Eyes: Conjunctivae and EOM are normal. Pupils are equal, round, and reactive to light. Right eye exhibits no discharge. Left eye exhibits no discharge. No scleral icterus.  Neck: Normal range of motion. Neck supple. No tracheal deviation present. No thyromegaly present.  Cardiovascular: Normal rate, regular rhythm and normal heart sounds.  Exam reveals no gallop and no friction rub.   No murmur heard. Pulmonary/Chest: Effort normal and breath sounds normal. No accessory muscle usage. No tachypnea. No respiratory distress. He has no decreased breath sounds. He has no wheezes. He has no rhonchi. He has no rales. He exhibits no tenderness.  Musculoskeletal: Normal range of motion. He exhibits no edema.  Lymphadenopathy:    He has no cervical adenopathy.  Neurological: He is alert and oriented to person, place, and time. No cranial nerve deficit. Coordination normal.  Skin: Skin is warm and dry. No rash noted. He is not diaphoretic. No erythema. No pallor.     Psychiatric: He has a normal mood and affect. His behavior is normal. Judgment and thought content normal.          Assessment & Plan:   Problem List Items Addressed This Visit      Unprioritized   Cellulitis of finger of right hand - Primary    Continue Clindamycin to complete 10 day course. Follow up if any recurrent redness, pain, fever, chills.    Muscle cramp    Chronic leg cramps at night. Reviewed previous labs. Encouraged him to consider adding B12 supplement as level was mildly low. We also discussed potential vascular evaluation, but he would like to hold off for now.  Return in about 3 months (around 09/12/2014) for Recheck.

## 2014-06-12 NOTE — Patient Instructions (Addendum)
Continue Clindamycin to complete 10 day course. Call if any fever, chills.  Consider starting B12 supplement, typically 5041mcg daily.  Follow up in 3 months or sooner as needed.

## 2014-06-12 NOTE — Assessment & Plan Note (Signed)
Continue Clindamycin to complete 10 day course. Follow up if any recurrent redness, pain, fever, chills.

## 2014-07-04 DIAGNOSIS — R252 Cramp and spasm: Secondary | ICD-10-CM | POA: Diagnosis not present

## 2014-07-04 DIAGNOSIS — L03011 Cellulitis of right finger: Secondary | ICD-10-CM

## 2014-07-11 DIAGNOSIS — M1711 Unilateral primary osteoarthritis, right knee: Secondary | ICD-10-CM | POA: Diagnosis not present

## 2014-07-11 DIAGNOSIS — M4316 Spondylolisthesis, lumbar region: Secondary | ICD-10-CM | POA: Diagnosis not present

## 2014-07-15 ENCOUNTER — Other Ambulatory Visit: Payer: Self-pay | Admitting: Cardiovascular Disease

## 2014-07-18 ENCOUNTER — Telehealth: Payer: Self-pay | Admitting: Cardiovascular Disease

## 2014-07-18 ENCOUNTER — Other Ambulatory Visit: Payer: Self-pay | Admitting: Internal Medicine

## 2014-07-18 MED ORDER — NEBIVOLOL HCL 10 MG PO TABS: 10.0000 mg | ORAL_TABLET | Freq: Every day | ORAL | Status: DC

## 2014-07-18 NOTE — Telephone Encounter (Signed)
Notified patient regarding medications. Told patient Dr. Acie Fredrickson would have to see you at least once a year to keep refilling medications. The patient will have Dr. Gilford Rile refill the medications and if he needs to be seen by Dr. Acie Fredrickson, he will contact our office for an appointment.

## 2014-07-18 NOTE — Telephone Encounter (Signed)
Pt calling saying he tried getting a refill on Bystolic and pharmacy said we refused it. So patient is a bit upset needs to have them please call patient.

## 2014-07-30 ENCOUNTER — Telehealth: Payer: Self-pay | Admitting: *Deleted

## 2014-07-30 MED ORDER — NEBIVOLOL HCL 10 MG PO TABS: 10.0000 mg | ORAL_TABLET | Freq: Two times a day (BID) | ORAL | Status: DC

## 2014-07-30 NOTE — Telephone Encounter (Signed)
Sent new Rx to pharmacy, Left vm notifying pt

## 2014-07-30 NOTE — Telephone Encounter (Signed)
Pt called stating that his BYSTOLIC 10 MG tablet  Rx that we sent to pharmacy on 07/18/14 was for One tablet daily and should have been for TAKE 1 TABLET (10 MG TOTAL) BY MOUTH 2 (TWO) TIMES DAILY.  Okay to send new Rx and with how many refills?

## 2014-07-30 NOTE — Telephone Encounter (Signed)
Fine to send new Rx. With 6 refills.

## 2014-08-08 ENCOUNTER — Encounter: Payer: Self-pay | Admitting: *Deleted

## 2014-08-08 NOTE — Telephone Encounter (Signed)
From: Seth Baxter Sent: 04/21/2014 12:41 PM EDT To: Seth Baxter Subject: RE:Flu Shot Clinic Saturday April 27, 2014  HI THERE, I WENT OVER TO [place] ON WEDNESDAY, SEPT 16, AND GOT MINE. DID THEY SEND YOU ANY INFORMATION CONCERNING THAT? THANKS FOR LETTING ME KNOW ABOUT THIS.  Seth Baxter [phone]  ----- Message ----- From: Seth Baxter Sent: 04/20/2014 6:37 PM EDT To: Seth Baxter Subject: Flu Shot Clinic Saturday April 27, 2014  [facility] will hold a [facility] on Saturday, September 26 from 9 am to noon. In order to plan appropriately, we ask you to please call the office to schedule an appointment time during the clinic. Our schedulers are ready to assist you, Monday through Friday, 8 am to 5 pm at [phone]. Thank you for choosing [facility] for your healthcare needs.

## 2014-08-15 DIAGNOSIS — M66372 Spontaneous rupture of flexor tendons, left ankle and foot: Secondary | ICD-10-CM | POA: Diagnosis not present

## 2014-09-12 ENCOUNTER — Ambulatory Visit: Admitting: Internal Medicine

## 2014-10-28 ENCOUNTER — Other Ambulatory Visit: Payer: Self-pay | Admitting: Internal Medicine

## 2014-10-28 NOTE — Telephone Encounter (Signed)
Okay to refill? Pt last seen on 06/12/14 & next appt on 4/21. Please advise

## 2014-10-28 NOTE — Telephone Encounter (Signed)
Rx faxed

## 2014-11-06 ENCOUNTER — Ambulatory Visit: Admitting: Podiatry

## 2014-11-21 ENCOUNTER — Encounter: Payer: Self-pay | Admitting: Internal Medicine

## 2014-11-21 ENCOUNTER — Ambulatory Visit (INDEPENDENT_AMBULATORY_CARE_PROVIDER_SITE_OTHER): Payer: Medicare Other | Admitting: Internal Medicine

## 2014-11-21 VITALS — BP 144/68 | HR 48 | Temp 97.5°F | Ht 63.0 in | Wt 131.2 lb

## 2014-11-21 DIAGNOSIS — R51 Headache: Secondary | ICD-10-CM

## 2014-11-21 DIAGNOSIS — G8929 Other chronic pain: Secondary | ICD-10-CM

## 2014-11-21 DIAGNOSIS — I10 Essential (primary) hypertension: Secondary | ICD-10-CM | POA: Diagnosis not present

## 2014-11-21 DIAGNOSIS — N529 Male erectile dysfunction, unspecified: Secondary | ICD-10-CM | POA: Diagnosis not present

## 2014-11-21 DIAGNOSIS — Z0000 Encounter for general adult medical examination without abnormal findings: Secondary | ICD-10-CM

## 2014-11-21 MED ORDER — TADALAFIL 20 MG PO TABS
10.0000 mg | ORAL_TABLET | ORAL | Status: DC | PRN
Start: 2014-11-21 — End: 2016-03-23

## 2014-11-21 NOTE — Assessment & Plan Note (Signed)
No improvement with Viagra. Will change back to Cialis.

## 2014-11-21 NOTE — Progress Notes (Signed)
Pre visit review using our clinic review tool, if applicable. No additional management support is needed unless otherwise documented below in the visit note. 

## 2014-11-21 NOTE — Progress Notes (Signed)
Subjective:    Patient ID: Seth Baxter, male    DOB: [birth year], 74 y.o.   MRN: [id number]  HPI  74YO male presents for follow up.  Feeling well except for chronic left shoulder pain. Planning to see ortho for repeat steroid injection. Has been golfing more, preparing for tournament.  BP generally well controlled 130s/80s, however did have some elevated readings 4/17 and 4/18 in setting of headache, with BP 170-190s/80s. This has now resolved.  Headaches have been well controlled.  Wt Readings from Last 3 Encounters:  11/21/14 131 lb 4 oz (59.535 kg)  06/12/14 129 lb 8 oz (58.741 kg)  06/04/14 132 lb (59.875 kg)   He notes persistent symptoms of ED. No improvement with use of Viagra. Would like to change back to Cialis which worked better for him in the past.  Past medical, surgical, family and social history per today's encounter.  Review of Systems  Constitutional: Negative for fever, chills, activity change, appetite change, fatigue and unexpected weight change.  Eyes: Negative for visual disturbance.  Respiratory: Negative for cough and shortness of breath.   Cardiovascular: Negative for chest pain, palpitations and leg swelling.  Gastrointestinal: Negative for nausea, vomiting, abdominal pain, diarrhea, constipation and abdominal distention.  Genitourinary: Negative for dysuria, urgency and difficulty urinating.  Musculoskeletal: Positive for myalgias and arthralgias. Negative for gait problem.  Skin: Negative for color change and rash.  Neurological: Positive for headaches.  Hematological: Negative for adenopathy.  Psychiatric/Behavioral: Negative for sleep disturbance and dysphoric mood. The patient is not nervous/anxious.        Objective:    BP 144/68 mmHg  Pulse 48  Temp(Src) 97.5 F (36.4 C) (Oral)  Ht 5\' 3"  (1.6 m)  Wt 131 lb 4 oz (59.535 kg)  BMI 23.26 kg/m2  SpO2 96% Physical Exam  Constitutional: He is oriented to person, place, and time. He  appears well-developed and well-nourished. No distress.  HENT:  Head: Normocephalic and atraumatic.  Right Ear: External ear normal.  Left Ear: External ear normal.  Nose: Nose normal.  Mouth/Throat: Oropharynx is clear and moist. No oropharyngeal exudate.  Eyes: Conjunctivae and EOM are normal. Pupils are equal, round, and reactive to light. Right eye exhibits no discharge. Left eye exhibits no discharge. No scleral icterus.  Neck: Normal range of motion. Neck supple. No tracheal deviation present. No thyromegaly present.  Cardiovascular: Normal rate, regular rhythm and normal heart sounds.  Exam reveals no gallop and no friction rub.   No murmur heard. Pulmonary/Chest: Effort normal and breath sounds normal. No accessory muscle usage. No tachypnea. No respiratory distress. He has no decreased breath sounds. He has no wheezes. He has no rhonchi. He has no rales. He exhibits no tenderness.  Musculoskeletal: He exhibits no edema.       Left shoulder: He exhibits decreased range of motion, tenderness and pain.  Lymphadenopathy:    He has no cervical adenopathy.  Neurological: He is alert and oriented to person, place, and time. No cranial nerve deficit. Coordination normal.  Skin: Skin is warm and dry. No rash noted. He is not diaphoretic. No erythema. No pallor.  Psychiatric: He has a normal mood and affect. His behavior is normal. Judgment and thought content normal.          Assessment & Plan:   Problem List Items Addressed This Visit      Unprioritized   Chronic headaches    Headaches well controlled with prn Fiorinal. Will continue.  Erectile dysfunction    No improvement with Viagra. Will change back to Cialis.      Hypertension - Primary    BP Readings from Last 3 Encounters:  11/21/14 144/68  06/12/14 156/82  06/05/14 149/64   BP generally well controlled. Will continue Bystolic and Amlodipine. If recurrent episode of elevated BP >160/100, discussed adding  Hydralazine.      Relevant Medications   tadalafil (CIALIS) 20 MG tablet    Other Visit Diagnoses    Routine general medical examination at a health care facility        Relevant Orders    CBC with Differential/Platelet    Comprehensive metabolic panel    Lipid panel    Microalbumin / creatinine urine ratio    Vit D  25 hydroxy (rtn osteoporosis monitoring)    PSA, Medicare        Return in about 6 months (around 05/23/2015).

## 2014-11-21 NOTE — Assessment & Plan Note (Signed)
Headaches well controlled with prn Fiorinal. Will continue.

## 2014-11-21 NOTE — Patient Instructions (Signed)
Labs Monday.  Follow up in 6 months.

## 2014-11-21 NOTE — Assessment & Plan Note (Signed)
BP Readings from Last 3 Encounters:  11/21/14 144/68  06/12/14 156/82  06/05/14 149/64   BP generally well controlled. Will continue Bystolic and Amlodipine. If recurrent episode of elevated BP >160/100, discussed adding Hydralazine.

## 2014-11-22 ENCOUNTER — Telehealth: Payer: Self-pay | Admitting: *Deleted

## 2014-11-22 NOTE — Telephone Encounter (Signed)
Fax from pharmacy, needing PA for Cialis. Requested telephone number from pharmacy to contact for PA, unable to complete online due to member not found. Pending telephone number to call so can complete PA.

## 2014-11-25 ENCOUNTER — Other Ambulatory Visit (INDEPENDENT_AMBULATORY_CARE_PROVIDER_SITE_OTHER): Payer: Medicare Other

## 2014-11-25 DIAGNOSIS — Z125 Encounter for screening for malignant neoplasm of prostate: Secondary | ICD-10-CM | POA: Diagnosis not present

## 2014-11-25 DIAGNOSIS — Z0000 Encounter for general adult medical examination without abnormal findings: Secondary | ICD-10-CM

## 2014-11-25 LAB — CBC WITH DIFFERENTIAL/PLATELET
BASOS ABS: 0 10*3/uL (ref 0.0–0.1)
Basophils Relative: 0.5 % (ref 0.0–3.0)
Eosinophils Absolute: 0.2 10*3/uL (ref 0.0–0.7)
Eosinophils Relative: 2.6 % (ref 0.0–5.0)
HEMATOCRIT: 45.2 % (ref 39.0–52.0)
HEMOGLOBIN: 15.5 g/dL (ref 13.0–17.0)
Lymphocytes Relative: 19.2 % (ref 12.0–46.0)
Lymphs Abs: 1.4 10*3/uL (ref 0.7–4.0)
MCHC: 34.2 g/dL (ref 30.0–36.0)
MCV: 95.5 fl (ref 78.0–100.0)
MONO ABS: 0.4 10*3/uL (ref 0.1–1.0)
MONOS PCT: 6.2 % (ref 3.0–12.0)
NEUTROS ABS: 5.1 10*3/uL (ref 1.4–7.7)
NEUTROS PCT: 71.5 % (ref 43.0–77.0)
PLATELETS: 206 10*3/uL (ref 150.0–400.0)
RBC: 4.74 Mil/uL (ref 4.22–5.81)
RDW: 13.2 % (ref 11.5–15.5)
WBC: 7.1 10*3/uL (ref 4.0–10.5)

## 2014-11-25 LAB — LIPID PANEL
Cholesterol: 155 mg/dL (ref 0–200)
HDL: 51.1 mg/dL (ref >39.00)
LDL CALC: 87 mg/dL (ref 0–99)
NonHDL: 103.9
TRIGLYCERIDES: 86 mg/dL (ref 0.0–149.0)
Total CHOL/HDL Ratio: 3
VLDL: 17.2 mg/dL (ref 0.0–40.0)

## 2014-11-25 LAB — COMPREHENSIVE METABOLIC PANEL
ALK PHOS: 100 U/L (ref 39–117)
ALT: 18 U/L (ref 0–53)
AST: 16 U/L (ref 0–37)
Albumin: 4.4 g/dL (ref 3.5–5.2)
BILIRUBIN TOTAL: 0.4 mg/dL (ref 0.2–1.2)
BUN: 18 mg/dL (ref 6–23)
CALCIUM: 9.3 mg/dL (ref 8.4–10.5)
CHLORIDE: 103 meq/L (ref 96–112)
CO2: 32 mEq/L (ref 19–32)
CREATININE: 0.74 mg/dL (ref 0.40–1.50)
GFR: 109.7 mL/min (ref >60.00)
Glucose, Bld: 128 mg/dL — ABNORMAL HIGH (ref 70–99)
Potassium: 4.7 mEq/L (ref 3.5–5.1)
Sodium: 139 mEq/L (ref 135–145)
TOTAL PROTEIN: 6.9 g/dL (ref 6.0–8.3)

## 2014-11-25 LAB — MICROALBUMIN / CREATININE URINE RATIO
Creatinine,U: 208.7 mg/dL
MICROALB/CREAT RATIO: 1.4 mg/g (ref 0.0–30.0)
Microalb, Ur: 2.9 mg/dL — ABNORMAL HIGH (ref 0.0–1.9)

## 2014-11-25 LAB — PSA, MEDICARE

## 2014-11-25 LAB — VITAMIN D 25 HYDROXY (VIT D DEFICIENCY, FRACTURES): VITD: 32.14 ng/mL (ref 30.00–100.00)

## 2014-11-27 DIAGNOSIS — S46012S Strain of muscle(s) and tendon(s) of the rotator cuff of left shoulder, sequela: Secondary | ICD-10-CM | POA: Diagnosis not present

## 2014-11-28 ENCOUNTER — Other Ambulatory Visit: Payer: Self-pay | Admitting: *Deleted

## 2014-11-28 ENCOUNTER — Encounter: Payer: Self-pay | Admitting: Internal Medicine

## 2014-11-28 MED ORDER — NEBIVOLOL HCL 10 MG PO TABS: ORAL_TABLET | ORAL | Status: DC

## 2014-11-28 NOTE — Telephone Encounter (Signed)
Fax from [facility], needing PA for Cialis. Started online, pending response.

## 2014-12-13 ENCOUNTER — Other Ambulatory Visit: Payer: Self-pay | Admitting: Internal Medicine

## 2014-12-13 NOTE — Telephone Encounter (Signed)
Last OV 4/16 ok to fill? 

## 2014-12-20 NOTE — Telephone Encounter (Signed)
Resent PA online.

## 2014-12-27 DIAGNOSIS — J069 Acute upper respiratory infection, unspecified: Secondary | ICD-10-CM | POA: Diagnosis not present

## 2015-01-01 ENCOUNTER — Encounter: Payer: Self-pay | Admitting: Internal Medicine

## 2015-01-01 ENCOUNTER — Other Ambulatory Visit: Payer: Self-pay | Admitting: *Deleted

## 2015-01-01 MED ORDER — NEBIVOLOL HCL 10 MG PO TABS: ORAL_TABLET | ORAL | Status: DC

## 2015-01-02 ENCOUNTER — Ambulatory Visit: Admitting: Nurse Practitioner

## 2015-01-02 DIAGNOSIS — H6123 Impacted cerumen, bilateral: Secondary | ICD-10-CM | POA: Diagnosis not present

## 2015-01-02 DIAGNOSIS — J0100 Acute maxillary sinusitis, unspecified: Secondary | ICD-10-CM | POA: Diagnosis not present

## 2015-01-03 ENCOUNTER — Other Ambulatory Visit: Payer: Self-pay | Admitting: Internal Medicine

## 2015-02-10 ENCOUNTER — Other Ambulatory Visit: Payer: Self-pay | Admitting: Internal Medicine

## 2015-02-10 DIAGNOSIS — Z1211 Encounter for screening for malignant neoplasm of colon: Secondary | ICD-10-CM

## 2015-03-07 DIAGNOSIS — H269 Unspecified cataract: Secondary | ICD-10-CM | POA: Diagnosis not present

## 2015-03-30 ENCOUNTER — Other Ambulatory Visit: Payer: Self-pay | Admitting: Internal Medicine

## 2015-03-31 NOTE — Telephone Encounter (Signed)
Last OV 4.21.16.  Please advise refill 

## 2015-04-03 ENCOUNTER — Other Ambulatory Visit: Payer: Self-pay | Admitting: Internal Medicine

## 2015-04-28 DIAGNOSIS — M4316 Spondylolisthesis, lumbar region: Secondary | ICD-10-CM | POA: Diagnosis not present

## 2015-05-14 ENCOUNTER — Encounter: Payer: Self-pay | Admitting: Internal Medicine

## 2015-05-16 DIAGNOSIS — L821 Other seborrheic keratosis: Secondary | ICD-10-CM | POA: Diagnosis not present

## 2015-05-16 DIAGNOSIS — L298 Other pruritus: Secondary | ICD-10-CM | POA: Diagnosis not present

## 2015-05-22 ENCOUNTER — Encounter: Payer: Self-pay | Admitting: Internal Medicine

## 2015-05-22 ENCOUNTER — Ambulatory Visit (INDEPENDENT_AMBULATORY_CARE_PROVIDER_SITE_OTHER): Payer: Medicare Other | Admitting: Internal Medicine

## 2015-05-22 VITALS — BP 146/69 | HR 44 | Temp 98.0°F | Ht 63.0 in | Wt 132.5 lb

## 2015-05-22 DIAGNOSIS — I10 Essential (primary) hypertension: Secondary | ICD-10-CM

## 2015-05-22 DIAGNOSIS — R51 Headache: Secondary | ICD-10-CM

## 2015-05-22 DIAGNOSIS — G4700 Insomnia, unspecified: Secondary | ICD-10-CM

## 2015-05-22 DIAGNOSIS — G8929 Other chronic pain: Secondary | ICD-10-CM

## 2015-05-22 LAB — COMPREHENSIVE METABOLIC PANEL
ALK PHOS: 89 U/L (ref 39–117)
ALT: 17 U/L (ref 0–53)
AST: 16 U/L (ref 0–37)
Albumin: 4.1 g/dL (ref 3.5–5.2)
BILIRUBIN TOTAL: 0.4 mg/dL (ref 0.2–1.2)
BUN: 21 mg/dL (ref 6–23)
CO2: 29 mEq/L (ref 19–32)
CREATININE: 0.75 mg/dL (ref 0.40–1.50)
Calcium: 9.5 mg/dL (ref 8.4–10.5)
Chloride: 103 mEq/L (ref 96–112)
GFR: 107.87 mL/min (ref >60.00)
GLUCOSE: 162 mg/dL — AB (ref 70–99)
Potassium: 3.8 mEq/L (ref 3.5–5.1)
Sodium: 139 mEq/L (ref 135–145)
TOTAL PROTEIN: 6.9 g/dL (ref 6.0–8.3)

## 2015-05-22 NOTE — Assessment & Plan Note (Signed)
Symptoms well controlled with Lunesta. Will continue. 

## 2015-05-22 NOTE — Patient Instructions (Signed)
Labs today.  Follow up in 6 months. 

## 2015-05-22 NOTE — Assessment & Plan Note (Signed)
BP Readings from Last 3 Encounters:  05/22/15 146/69  11/21/14 144/68  06/12/14 156/82   BP generally well controlled. Will continue current medications. Renal function with labs.

## 2015-05-22 NOTE — Assessment & Plan Note (Signed)
Headaches generally well controlled with intermittent Fiorinal. Will continue.
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
## 2015-05-22 NOTE — Progress Notes (Signed)
Pre visit review using our clinic review tool, if applicable. No additional management support is needed unless otherwise documented below in the visit note. 

## 2015-06-09 ENCOUNTER — Other Ambulatory Visit: Payer: Self-pay | Admitting: Internal Medicine

## 2015-06-19 ENCOUNTER — Encounter: Admission: RE | Payer: Self-pay | Source: Ambulatory Visit

## 2015-06-19 ENCOUNTER — Ambulatory Visit: Admission: RE | Admit: 2015-06-19 | Payer: Medicare Other | Source: Ambulatory Visit | Admitting: Gastroenterology

## 2015-06-19 SURGERY — COLONOSCOPY WITH PROPOFOL
Anesthesia: General

## 2015-07-07 ENCOUNTER — Encounter: Payer: Self-pay | Admitting: Internal Medicine

## 2015-07-07 ENCOUNTER — Ambulatory Visit (INDEPENDENT_AMBULATORY_CARE_PROVIDER_SITE_OTHER): Payer: Medicare Other | Admitting: Internal Medicine

## 2015-07-07 VITALS — BP 153/69 | HR 43 | Temp 98.1°F | Ht 63.0 in | Wt 135.1 lb

## 2015-07-07 DIAGNOSIS — R1013 Epigastric pain: Secondary | ICD-10-CM

## 2015-07-07 DIAGNOSIS — G8929 Other chronic pain: Secondary | ICD-10-CM | POA: Diagnosis not present

## 2015-07-07 MED ORDER — PANTOPRAZOLE SODIUM 40 MG PO TBEC: 40.0000 mg | DELAYED_RELEASE_TABLET | Freq: Two times a day (BID) | ORAL | Status: DC

## 2015-07-07 NOTE — Patient Instructions (Signed)
Start Pantoprazole 40mg  twice daily.  Labs today.  Follow up in 1 week.

## 2015-07-07 NOTE — Progress Notes (Signed)
Pre visit review using our clinic review tool, if applicable. No additional management support is needed unless otherwise documented below in the visit note. 

## 2015-07-07 NOTE — Assessment & Plan Note (Signed)
Recent epigastric pain. Symptoms most c/w gastritis or ulcer. Encouraged him to limit use of Meloxicam, however he reports he cannot function without this medication. Will add Pantoprazole 40mg  po bid. Will send testing for H. Pylori, CMP, CBC, Lipase. Follow up 1 week. Discussed EGD if symptoms not improving.
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
## 2015-07-08 LAB — CBC
HCT: 45.1 % (ref 39.0–52.0)
Hemoglobin: 15 g/dL (ref 13.0–17.0)
MCHC: 33.3 g/dL (ref 30.0–36.0)
MCV: 96.9 fl (ref 78.0–100.0)
Platelets: 309 10*3/uL (ref 150.0–400.0)
RBC: 4.66 Mil/uL (ref 4.22–5.81)
RDW: 13.7 % (ref 11.5–15.5)
WBC: 7.9 10*3/uL (ref 4.0–10.5)

## 2015-07-08 LAB — COMPREHENSIVE METABOLIC PANEL
ALBUMIN: 4.3 g/dL (ref 3.5–5.2)
ALT: 18 U/L (ref 0–53)
AST: 17 U/L (ref 0–37)
Alkaline Phosphatase: 107 U/L (ref 39–117)
BUN: 21 mg/dL (ref 6–23)
CALCIUM: 9.4 mg/dL (ref 8.4–10.5)
CHLORIDE: 102 meq/L (ref 96–112)
CO2: 29 mEq/L (ref 19–32)
Creatinine, Ser: 0.9 mg/dL (ref 0.40–1.50)
GFR: 87.37 mL/min (ref >60.00)
Glucose, Bld: 104 mg/dL — ABNORMAL HIGH (ref 70–99)
POTASSIUM: 4.3 meq/L (ref 3.5–5.1)
Sodium: 141 mEq/L (ref 135–145)
Total Bilirubin: 0.3 mg/dL (ref 0.2–1.2)
Total Protein: 6.9 g/dL (ref 6.0–8.3)

## 2015-07-08 LAB — LIPASE: Lipase: 6 U/L — ABNORMAL LOW (ref 11.0–59.0)

## 2015-07-09 ENCOUNTER — Telehealth: Payer: Self-pay | Admitting: Internal Medicine

## 2015-07-09 NOTE — Telephone Encounter (Signed)
Pt called and wanted to speak to lab. No one picked up. He wanted to know about a test that is done in the lab. I could not get information from him because he hung up on me. Lab please call pt.  Thank you, kp

## 2015-07-10 ENCOUNTER — Other Ambulatory Visit: Payer: Medicare Other

## 2015-07-10 ENCOUNTER — Telehealth: Payer: Self-pay | Admitting: Internal Medicine

## 2015-07-10 NOTE — Telephone Encounter (Signed)
Follow up pt hand written note saying medication was causing headaches.

## 2015-07-11 ENCOUNTER — Other Ambulatory Visit: Payer: Self-pay | Admitting: Internal Medicine

## 2015-07-11 LAB — H. PYLORI BREATH TEST: H. pylori Breath Test: NOT DETECTED

## 2015-07-25 ENCOUNTER — Ambulatory Visit (INDEPENDENT_AMBULATORY_CARE_PROVIDER_SITE_OTHER): Payer: Medicare Other | Admitting: Internal Medicine

## 2015-07-25 ENCOUNTER — Encounter: Payer: Self-pay | Admitting: Internal Medicine

## 2015-07-25 VITALS — BP 167/74 | HR 47 | Temp 97.8°F | Ht 63.0 in | Wt 134.5 lb

## 2015-07-25 DIAGNOSIS — R1013 Epigastric pain: Secondary | ICD-10-CM

## 2015-07-25 DIAGNOSIS — G8929 Other chronic pain: Secondary | ICD-10-CM | POA: Diagnosis not present

## 2015-07-25 DIAGNOSIS — I10 Essential (primary) hypertension: Secondary | ICD-10-CM | POA: Diagnosis not present

## 2015-07-25 MED ORDER — CELECOXIB 200 MG PO CAPS: 200.0000 mg | ORAL_CAPSULE | Freq: Two times a day (BID) | ORAL | Status: DC

## 2015-07-25 MED ORDER — OMEPRAZOLE 40 MG PO CPDR: 40.0000 mg | DELAYED_RELEASE_CAPSULE | Freq: Two times a day (BID) | ORAL | Status: DC

## 2015-07-25 NOTE — Assessment & Plan Note (Signed)
BP Readings from Last 3 Encounters:  07/25/15 167/74  07/07/15 153/69  05/22/15 146/69   BP elevated today, however pt upset with death of his dog. Will monitor for now. Continue current medications. Repeat check in 4 weeks.
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
## 2015-07-25 NOTE — Patient Instructions (Addendum)
Increase Omeprazole to twice daily.  Stop Meloxicam.  Start Celebrex 200mg  twice daily.  We will set up evaluation with GI, Dr. Candace Cruise.

## 2015-07-25 NOTE — Progress Notes (Signed)
Pre-visit discussion using our clinic review tool. No additional management support is needed unless otherwise documented below in the visit note.  

## 2015-07-25 NOTE — Assessment & Plan Note (Signed)
Some persistent abdominal pain. Testing for H. Pylori was negative. Will stop Meloxicam and change to Celebrex. Increase Omeprazole to 40mg  po bid. Will set up GI evaluation. Follow up in 4 weeks.

## 2015-08-03 ENCOUNTER — Other Ambulatory Visit: Payer: Self-pay | Admitting: Internal Medicine

## 2015-08-06 ENCOUNTER — Encounter: Payer: Self-pay | Admitting: Internal Medicine

## 2015-08-20 DIAGNOSIS — H1013 Acute atopic conjunctivitis, bilateral: Secondary | ICD-10-CM | POA: Diagnosis not present

## 2015-09-09 ENCOUNTER — Other Ambulatory Visit: Payer: Self-pay

## 2015-09-09 MED ORDER — ESZOPICLONE 3 MG PO TABS: 3.0000 mg | ORAL_TABLET | Freq: Every day | ORAL | Status: DC

## 2015-09-11 ENCOUNTER — Telehealth: Payer: Self-pay | Admitting: Internal Medicine

## 2015-09-11 NOTE — Telephone Encounter (Signed)
Pt had a refill of his Eszopiclone 3 MG TABS sent to [facility] . The Pharmacy said they never received the order. Pt has enough pills until Saturday.

## 2015-09-12 ENCOUNTER — Other Ambulatory Visit: Payer: Self-pay

## 2015-09-12 NOTE — Telephone Encounter (Signed)
Sleep med already printed.

## 2015-09-12 NOTE — Telephone Encounter (Signed)
Already filled

## 2015-10-15 DIAGNOSIS — R001 Bradycardia, unspecified: Secondary | ICD-10-CM | POA: Diagnosis not present

## 2015-10-15 DIAGNOSIS — R109 Unspecified abdominal pain: Secondary | ICD-10-CM | POA: Diagnosis not present

## 2015-10-15 DIAGNOSIS — Z86010 Personal history of colonic polyps: Secondary | ICD-10-CM | POA: Diagnosis not present

## 2015-10-22 ENCOUNTER — Telehealth: Payer: Self-pay | Admitting: *Deleted

## 2015-10-22 NOTE — Telephone Encounter (Signed)
Received fax from [facility] - GI requesting cardio clearance prior to colonoscopy.  Dr Gilford Rile also recommends pt will need cardio clearance.  Advised pt. Pt states he will schedule an appt with his cardiologist.

## 2015-10-28 ENCOUNTER — Ambulatory Visit (INDEPENDENT_AMBULATORY_CARE_PROVIDER_SITE_OTHER): Payer: Medicare Other | Admitting: Cardiology

## 2015-10-28 ENCOUNTER — Telehealth: Payer: Self-pay | Admitting: Cardiology

## 2015-10-28 ENCOUNTER — Encounter: Payer: Self-pay | Admitting: Cardiology

## 2015-10-28 VITALS — BP 134/78 | HR 44 | Ht 64.0 in | Wt 134.2 lb

## 2015-10-28 DIAGNOSIS — Z01818 Encounter for other preprocedural examination: Secondary | ICD-10-CM | POA: Diagnosis not present

## 2015-10-28 DIAGNOSIS — R001 Bradycardia, unspecified: Secondary | ICD-10-CM

## 2015-10-28 DIAGNOSIS — I10 Essential (primary) hypertension: Secondary | ICD-10-CM | POA: Diagnosis not present

## 2015-10-28 DIAGNOSIS — I440 Atrioventricular block, first degree: Secondary | ICD-10-CM

## 2015-10-28 MED ORDER — HYDRALAZINE HCL 25 MG PO TABS: 25.0000 mg | ORAL_TABLET | Freq: Three times a day (TID) | ORAL | Status: DC
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
## 2015-10-28 NOTE — Patient Instructions (Signed)
Medication Instructions:  Your physician has recommended you make the following change in your medication:  1. Wean Bystolic (nebivolol) by taking 5 mg twice a day for one week, then decreasing to 5 mg once a day for one week and then stopping this medication. 2. Start hydralazine 25 mg three times a day   Labwork: None ordered  Testing/Procedures: Your physician has requested that you have an echocardiogram. Echocardiography is a painless test that uses sound waves to create images of your heart. It provides your doctor with information about the size and shape of your heart and how well your heart's chambers and valves are working. This procedure takes approximately one hour. There are no restrictions for this procedure.  Date & Time: ___________________________________________________________  EKG in 3 weeks  Date & Time: ___________________________________________________________  Follow-Up: Your physician wants you to follow-up in: 6 months with Dr. Yvone Neu. You will receive a reminder letter in the mail two months in advance. If you don't receive a letter, please call our office to schedule the follow-up appointment.   Any Other Special Instructions Will Be Listed Below (If Applicable).  Your physician has requested that you regularly monitor and record your blood pressure readings at home. Please use the same machine at the same time of day to check your readings and record them to bring to your follow-up visit.  Call the office if your blood pressure remains higher than 140/80.    If you need a refill on your cardiac medications before your next appointment, please call your pharmacy.

## 2015-10-28 NOTE — Telephone Encounter (Signed)
Let patient know that I called pharmacy to get prescription problem resolved. Initial prescription was sent to [facility] and patient wanted it sent to [facility]. Let him know to call back if there were any other problems.

## 2015-11-02 ENCOUNTER — Other Ambulatory Visit: Payer: Self-pay | Admitting: Internal Medicine

## 2015-11-12 ENCOUNTER — Telehealth: Payer: Self-pay | Admitting: *Deleted

## 2015-11-12 ENCOUNTER — Ambulatory Visit (INDEPENDENT_AMBULATORY_CARE_PROVIDER_SITE_OTHER): Payer: Medicare Other | Admitting: *Deleted

## 2015-11-12 DIAGNOSIS — R001 Bradycardia, unspecified: Secondary | ICD-10-CM

## 2015-11-12 DIAGNOSIS — I10 Essential (primary) hypertension: Secondary | ICD-10-CM

## 2015-11-12 DIAGNOSIS — Z01818 Encounter for other preprocedural examination: Secondary | ICD-10-CM | POA: Diagnosis not present

## 2015-11-12 DIAGNOSIS — I440 Atrioventricular block, first degree: Secondary | ICD-10-CM | POA: Diagnosis not present

## 2015-11-12 NOTE — Progress Notes (Signed)
EKG from 11/12/2015 was personally reviewed by me. This showed sinus bradycardia, ventricular rate of 52 BPM, first-degree AV block. Nonspecific ST-T wave changes. In comparison to patient's EKG on his initial visit from 10/20/2015, heart rate has improved now that he has weaned off his beta blocker.  Please continue to monitor blood pressure at home. BP log. If blood pressure measurements at home have been consistently elevated more than 140/80, patient should give [place] a call and we may need to switch his hydralazine to BiDil.

## 2015-11-12 NOTE — Progress Notes (Signed)
1.) Reason for visit: EKG follow up after discontinuing bystolic  2.) Name of MD requesting visit: Dr. Yvone Neu  3.) H&P: Per office visit 0000000 "Wean Bystolic (nebivolol) by taking 5 mg twice a day for one week, then decreasing to 5 mg once a day for one week and then stopping this medication."  4.) ROS related to problem: Patient states that he has had some shortness of breath but otherwise feeling OK.  5.) Assessment and plan per MD: Will forward to Dr. Yvone Neu.

## 2015-11-12 NOTE — Telephone Encounter (Signed)
Left detailed voicemail message for patient to continue monitoring blood pressures and that if they consistently remain greater than 140/80 to please give [place] a call as we may need to make some medication changes. Left instructions to please call back if any further questions and provided return phone number.

## 2015-11-13 ENCOUNTER — Other Ambulatory Visit: Payer: Self-pay

## 2015-11-13 ENCOUNTER — Ambulatory Visit (INDEPENDENT_AMBULATORY_CARE_PROVIDER_SITE_OTHER): Payer: Medicare Other

## 2015-11-13 DIAGNOSIS — Z01818 Encounter for other preprocedural examination: Secondary | ICD-10-CM | POA: Diagnosis not present

## 2015-11-13 DIAGNOSIS — I10 Essential (primary) hypertension: Secondary | ICD-10-CM | POA: Diagnosis not present

## 2015-11-13 DIAGNOSIS — I440 Atrioventricular block, first degree: Secondary | ICD-10-CM | POA: Diagnosis not present

## 2015-11-13 DIAGNOSIS — R001 Bradycardia, unspecified: Secondary | ICD-10-CM

## 2015-11-17 ENCOUNTER — Other Ambulatory Visit: Payer: Self-pay | Admitting: *Deleted

## 2015-11-17 ENCOUNTER — Other Ambulatory Visit: Payer: Self-pay | Admitting: Specialist

## 2015-11-17 ENCOUNTER — Encounter: Payer: Self-pay | Admitting: Cardiology

## 2015-11-17 ENCOUNTER — Telehealth: Payer: Self-pay | Admitting: Cardiology

## 2015-11-17 DIAGNOSIS — M4316 Spondylolisthesis, lumbar region: Secondary | ICD-10-CM | POA: Diagnosis not present

## 2015-11-17 NOTE — Telephone Encounter (Signed)
Patient dropped off envelope with phone number [phone] written on one side and BP readings on the other side. Note inside was written as follows:  Seth Baxter 05-01-1941  [phone]  Dr. Yvone Neu,       I have been having more frequent headaches since starting the Hydralazine 25 mg and elevated B/P. This past weekend was very difficult. These are my B/P readings;  Apr 15 07:30 Prior to taking Medications: Woke with a headache.Evelina Bucy meds (L) 180/95 P.53  (R) 178/95 P.53 09:50 a.m. (L) 150/83 P.59  (R) 148/76 P.57 10:00 a.m. (L) 133/75 P.58  (R) 142/77 P.58 10:00 P.M. Took medication before going to bed Apr 16 01:00 a.m. Woke with a pounding headache that lasted until 2 p.m. (L) 182/92 P.58  (R) 172/94 P.60 01:30 a.m. (L) 170/87 P.53  (R) 172/91 P.52 02:30 a.m. (L) 167/85 P.51  (R) 165/81 P.50 03:30 a.m. (l) 159/50 P.50  (R) 164/81 P.50 Was able to go back to sleep from 4 a.m. To 5:30 and was woken by a pounding headache  Took my medication. 05:45 a.m. (L) 197/95 P.55  (R) 192/94 P.56 07:10 a.m. (L) 171/83 P.56  (R) 174/84 P.59 08:30 a.m. (L) 167/87 P.56  (R) 176/92 P.56 09:30 Took another hydralazine medication 11:35 a.m. (L) 133/74 P.53  (R) 138/83 P.53 4:40 p.m. Took 25 mg Hydralazine (L) 176/89 P.53  (R) 180/92 P.53 Headache has returned Laid down for ten (10) minutes (L) 146/76 P.51  (R) 182/92 P.52 10:30 p.m. Took medication (L) 179/87 P.54  (R) 182/92 P.55 Apr 17 This morning 08:30 (L) 133/75 P.75  (R) 146/82 P.72  I would appreciate some in put on how we can lower this so my headahces will cease.  Seth Baxter

## 2015-11-17 NOTE — Telephone Encounter (Signed)
Patient came into office after receiving voicemail message. Reviewed blood pressures with patient and noted that once he took his medications his blood pressures did come down some. Also noted that his pressures were more elevated in relation to the headache. He states that he had taken migraine medications and was not able to get any relief. Asked him if he was being treated by anyone for the migraines and he stated yes but he felt this was more due to blood pressures. He felt that these elevated pressures were related to his medications not being strong enough. Let him know that I had forwarded this note to Dr. Yvone Neu for her recommendations and will call him back once she reviews this. He verbalized understanding and agreed with plan of care at this time. Instructed him to continue monitoring his blood pressures and will be in touch with him soon.

## 2015-11-17 NOTE — Telephone Encounter (Signed)
Left message for patient to call back regarding blood pressure readings that he dropped off here to front office. Just wanted to discuss those readings with him.

## 2015-11-18 ENCOUNTER — Telehealth: Payer: Self-pay | Admitting: *Deleted

## 2015-11-18 NOTE — Telephone Encounter (Signed)
Since hydralazine was a new prescription and could be causing the headaches, discussed in detail with Dr. Rockey Situ for other alternatives. Bidil was also discussed but it is still a combination drug of both hydralazine/isosorbide which can still cause headaches and may be a little more expensive. Clonidine could be used but that can also cause bradycardia similar to bystolic. So will start on hydrochlorothiazide 25 mg once daily and also Cardura 8 mg twice daily taking only  tablet twice daily for two weeks and then slowly increasing to whole tablet twice daily. Eat more bananas to replace your potassium while on the hydrochlorothiazide as this can cause increased urine output which can cause your potassium levels to drop. Please continue to monitor your blood pressures and you may continue to see some elevated pressures for a few days until these new medications start to work. Keep a log of your pressures and bring the log to your next follow up appointment.   Patient then stated that after thinking more about this he wants to cancel everything. He states that the only reason he came to see Dr. Yvone Neu was for cardiac clearance for a colonoscopy because anestheologist would not do test due to his low heart rate. He stated that he has had more health problems in the last few weeks than in his whole life as he is very active and has not had any real health issues. Since changing his medicines he has had new health issues and he just does not want to do anything else at this point and wants to stop the new medications go back to his primary care physician and restart previous medications that he was on prior to all of this. He was very appreciative to all that I had done to help him with this but that he just doesn't want anything new and wants to go back to what he was on before. He stated that he had an appointment tomorrow with his primary and would discuss with her about going back on his medications. Let him know  to please contact me at any time if I can help in any way. He verbalized understanding of our conversation and had no further questions at this time.

## 2015-11-18 NOTE — Telephone Encounter (Signed)
Received a PA request for Cialis 20mg  tablets. Per Covermymeds-no PA required. Faxed information to [employer].

## 2015-11-18 NOTE — Telephone Encounter (Signed)
Spoke to patients wife and patient is still at the gym and will call back later.

## 2015-11-18 NOTE — Telephone Encounter (Signed)
Pt calling back. States he missed a call from [place].  Please call back  [phone]

## 2015-11-19 ENCOUNTER — Ambulatory Visit (INDEPENDENT_AMBULATORY_CARE_PROVIDER_SITE_OTHER): Payer: Medicare Other | Admitting: Family Medicine

## 2015-11-19 ENCOUNTER — Encounter: Payer: Self-pay | Admitting: Family Medicine

## 2015-11-19 VITALS — BP 154/88 | HR 72 | Temp 98.1°F | Ht 64.0 in | Wt 136.0 lb

## 2015-11-19 DIAGNOSIS — I10 Essential (primary) hypertension: Secondary | ICD-10-CM | POA: Diagnosis not present

## 2015-11-19 DIAGNOSIS — R001 Bradycardia, unspecified: Secondary | ICD-10-CM | POA: Insufficient documentation

## 2015-11-19 NOTE — Assessment & Plan Note (Signed)
Established problem, worsening. BP up today. Restarting Bystolic at 10 mg daily. Stop Hydralazine. Follow up with PCP in 1-2 weeks.

## 2015-11-19 NOTE — Patient Instructions (Signed)
Stop the hydralazine.  Restart the Bystolic.  Follow up closely with Dr. Gilford Rile.  Take care  Dr. Lacinda Axon

## 2015-11-19 NOTE — Progress Notes (Signed)
Pre visit review using our clinic review tool, if applicable. No additional management support is needed unless otherwise documented below in the visit note. 

## 2015-11-19 NOTE — Progress Notes (Signed)
   Subjective:  Patient ID: Seth Baxter, male    DOB: Nov 03, 1940  Age: 75 y.o. MRN: [id number]  CC: Elevated BP  HPI:  75 year old male presents with the above complaint.  HTN  Patient states that he was recently scheduled to have a colonoscopy and was found to have bradycardia and was subsequently sent to cardiology for evaluation.  He states that the cardiologist took him off of his blood pressure medication and put him on hydralazine.  I states that since that time his blood pressures have been elevated in he's been not feeling well. He has been experiencing significant headache. He would like to stop the hydralazine to restart his Bystolic.  Of note, patient states that he has been bradycardic for years and that he was not sure why he needed to be seen by cardiologist.  No other complaint at this time.  No other reasons for his recent elevated blood pressure.  Social Hx   Social History   Social History  . Marital Status: Married    Spouse Name: N/A  . Number of Children: N/A  . Years of Education: N/A   Social History Main Topics  . Smoking status: Former Smoker -- 0.25 packs/day for 5 years    Types: Cigarettes    Quit date: 08/03/1963  . Smokeless tobacco: Never Used  . Alcohol Use: No  . Drug Use: No  . Sexual Activity: Not Currently   Other Topics Concern  . None   Social History Narrative   Review of Systems  Constitutional: Negative.   Cardiovascular: Negative for chest pain.  Neurological: Positive for headaches.   Objective:  BP 154/88 mmHg  Pulse 72  Temp(Src) 98.1 F (36.7 C) (Oral)  Ht 5\' 4"  (1.626 m)  Wt 136 lb (61.689 kg)  BMI 23.33 kg/m2  SpO2 97%  BP/Weight 11/19/2015 11/12/2015 123456  Systolic BP 123456 [id number]  Diastolic BP 88 84 78  Wt. (Lbs) 136 134.5 134.25  BMI 23.33 23.08 23.03   Physical Exam  Constitutional: He is oriented to person, place, and time. He appears well-developed. No distress.  Cardiovascular: Normal rate  and regular rhythm.   Pulmonary/Chest: Effort normal and breath sounds normal.  Neurological: He is alert and oriented to person, place, and time.  Psychiatric: He has a normal mood and affect.  Vitals reviewed.  Lab Results  Component Value Date   WBC 7.9 07/07/2015   HGB 15.0 07/07/2015   HCT 45.1 07/07/2015   PLT 309.0 07/07/2015   GLUCOSE 104* 07/07/2015   CHOL 155 11/25/2014   TRIG 86.0 11/25/2014   HDL 51.10 11/25/2014   LDLCALC 87 11/25/2014   ALT 18 07/07/2015   AST 17 07/07/2015   NA 141 07/07/2015   K 4.3 07/07/2015   CL 102 07/07/2015   CREATININE 0.90 07/07/2015   BUN 21 07/07/2015   CO2 29 07/07/2015   TSH 0.79 03/06/2012   PSA 0.01 Repeated and verified X2.* 11/25/2014   HGBA1C 6.3 06/21/2013   MICROALBUR 2.9* 11/25/2014    Assessment & Plan:   Problem List Items Addressed This Visit    Hypertension - Primary    Established problem, worsening. BP up today. Restarting Bystolic at 10 mg daily. Stop Hydralazine. Follow up with PCP in 1-2 weeks.         Follow-up: 1-2 weeks with PCP.  [place]

## 2015-11-21 ENCOUNTER — Ambulatory Visit
Admission: RE | Admit: 2015-11-21 | Discharge: 2015-11-21 | Disposition: A | Discharge status: Home / Self Care | Payer: Medicare Other | Source: Ambulatory Visit | Attending: Specialist | Admitting: Specialist

## 2015-11-21 DIAGNOSIS — M47816 Spondylosis without myelopathy or radiculopathy, lumbar region: Secondary | ICD-10-CM | POA: Diagnosis not present

## 2015-11-21 DIAGNOSIS — M4316 Spondylolisthesis, lumbar region: Secondary | ICD-10-CM | POA: Diagnosis not present

## 2015-11-21 DIAGNOSIS — M5126 Other intervertebral disc displacement, lumbar region: Secondary | ICD-10-CM | POA: Diagnosis not present

## 2015-11-25 ENCOUNTER — Ambulatory Visit

## 2015-11-28 ENCOUNTER — Ambulatory Visit: Payer: Medicare Other

## 2015-11-28 DIAGNOSIS — M4316 Spondylolisthesis, lumbar region: Secondary | ICD-10-CM | POA: Diagnosis not present

## 2015-12-10 ENCOUNTER — Other Ambulatory Visit: Payer: Self-pay | Admitting: Internal Medicine

## 2015-12-10 DIAGNOSIS — M545 Low back pain: Secondary | ICD-10-CM | POA: Diagnosis not present

## 2015-12-10 DIAGNOSIS — M5416 Radiculopathy, lumbar region: Secondary | ICD-10-CM | POA: Diagnosis not present

## 2015-12-11 ENCOUNTER — Telehealth: Payer: Self-pay | Admitting: *Deleted

## 2015-12-11 ENCOUNTER — Other Ambulatory Visit: Payer: Self-pay | Admitting: Internal Medicine

## 2015-12-11 NOTE — Telephone Encounter (Signed)
Patient requested a medication refill for Eszopiclone  Pharmacy [facility] [address]

## 2015-12-11 NOTE — Telephone Encounter (Signed)
This was refilled yesterday. thanks

## 2015-12-19 DIAGNOSIS — M5416 Radiculopathy, lumbar region: Secondary | ICD-10-CM | POA: Diagnosis not present

## 2015-12-19 DIAGNOSIS — M545 Low back pain: Secondary | ICD-10-CM | POA: Diagnosis not present

## 2015-12-22 ENCOUNTER — Ambulatory Visit: Payer: Medicare Other | Admitting: Internal Medicine

## 2016-01-05 ENCOUNTER — Other Ambulatory Visit: Payer: Self-pay | Admitting: Internal Medicine

## 2016-01-06 ENCOUNTER — Other Ambulatory Visit: Payer: Self-pay | Admitting: *Deleted

## 2016-01-06 MED ORDER — AMLODIPINE BESYLATE 10 MG PO TABS: 10.0000 mg | ORAL_TABLET | Freq: Every day | ORAL | Status: DC

## 2016-01-06 MED ORDER — NEBIVOLOL HCL 10 MG PO TABS: 10.0000 mg | ORAL_TABLET | Freq: Two times a day (BID) | ORAL | Status: DC

## 2016-01-12 ENCOUNTER — Other Ambulatory Visit: Payer: Self-pay | Admitting: *Deleted

## 2016-01-12 MED ORDER — AMLODIPINE BESYLATE 10 MG PO TABS: 10.0000 mg | ORAL_TABLET | Freq: Every day | ORAL | Status: DC

## 2016-01-15 ENCOUNTER — Encounter: Payer: Self-pay | Admitting: Internal Medicine

## 2016-01-15 ENCOUNTER — Ambulatory Visit (INDEPENDENT_AMBULATORY_CARE_PROVIDER_SITE_OTHER): Payer: Medicare Other | Admitting: Internal Medicine

## 2016-01-15 VITALS — BP 140/78 | HR 48 | Ht 63.0 in | Wt 132.0 lb

## 2016-01-15 DIAGNOSIS — B351 Tinea unguium: Secondary | ICD-10-CM | POA: Insufficient documentation

## 2016-01-15 DIAGNOSIS — I10 Essential (primary) hypertension: Secondary | ICD-10-CM

## 2016-01-15 DIAGNOSIS — G8929 Other chronic pain: Secondary | ICD-10-CM | POA: Diagnosis not present

## 2016-01-15 DIAGNOSIS — R1013 Epigastric pain: Secondary | ICD-10-CM

## 2016-01-15 LAB — COMPREHENSIVE METABOLIC PANEL
ALT: 17 U/L (ref 0–53)
AST: 17 U/L (ref 0–37)
Albumin: 3.9 g/dL (ref 3.5–5.2)
Alkaline Phosphatase: 98 U/L (ref 39–117)
BILIRUBIN TOTAL: 0.3 mg/dL (ref 0.2–1.2)
BUN: 21 mg/dL (ref 6–23)
CALCIUM: 8.9 mg/dL (ref 8.4–10.5)
CHLORIDE: 105 meq/L (ref 96–112)
CO2: 31 meq/L (ref 19–32)
Creatinine, Ser: 0.84 mg/dL (ref 0.40–1.50)
GFR: 94.48 mL/min (ref >60.00)
GLUCOSE: 138 mg/dL — AB (ref 70–99)
POTASSIUM: 3.6 meq/L (ref 3.5–5.1)
Sodium: 141 mEq/L (ref 135–145)
Total Protein: 6.5 g/dL (ref 6.0–8.3)

## 2016-01-15 NOTE — Assessment & Plan Note (Signed)
BP Readings from Last 3 Encounters:  01/15/16 140/78  11/19/15 154/88  11/12/15 165/84   BP well controlled generally. Continue current medications. Renal function with labs.

## 2016-01-15 NOTE — Assessment & Plan Note (Signed)
Discussed options including topical medication, oral meds and treatment with podiatry. He would like to see podiatry to discuss potential laser treatment.

## 2016-01-15 NOTE — Patient Instructions (Signed)
Labs today

## 2016-01-15 NOTE — Progress Notes (Signed)
Pre visit review using our clinic review tool, if applicable. No additional management support is needed unless otherwise documented below in the visit note. 
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
## 2016-01-15 NOTE — Assessment & Plan Note (Signed)
Symptoms have resolved. He opted not to have endoscopy at this time. Will continue Ranitidine and Omeprazole.

## 2016-02-04 ENCOUNTER — Ambulatory Visit (INDEPENDENT_AMBULATORY_CARE_PROVIDER_SITE_OTHER): Payer: Medicare Other | Admitting: Internal Medicine

## 2016-02-04 ENCOUNTER — Encounter: Payer: Self-pay | Admitting: Internal Medicine

## 2016-02-04 VITALS — BP 150/76 | HR 51 | Ht 63.0 in | Wt 131.6 lb

## 2016-02-04 DIAGNOSIS — Z0000 Encounter for general adult medical examination without abnormal findings: Secondary | ICD-10-CM | POA: Diagnosis not present

## 2016-02-04 DIAGNOSIS — E785 Hyperlipidemia, unspecified: Secondary | ICD-10-CM

## 2016-02-04 DIAGNOSIS — Z23 Encounter for immunization: Secondary | ICD-10-CM | POA: Diagnosis not present

## 2016-02-04 MED ORDER — NEBIVOLOL HCL 10 MG PO TABS: 10.0000 mg | ORAL_TABLET | Freq: Every day | ORAL | Status: DC
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
## 2016-02-04 NOTE — Patient Instructions (Signed)

## 2016-02-04 NOTE — Progress Notes (Signed)
Pre visit review using our clinic review tool, if applicable. No additional management support is needed unless otherwise documented below in the visit note. 

## 2016-02-04 NOTE — Assessment & Plan Note (Signed)
Wellness exam normal today. Will check fasting labs later this month. Discussed PSA testing, he prefers to hold off on this for now. Prevnar today. Other vaccines are UTD. Colonoscopy will be scheduled after his upcoming vacation. Encouraged continued healthy diet and exercise.

## 2016-02-04 NOTE — Addendum Note (Signed)
Addended by: Elpidio Galea T on: 02/04/2016 08:17 AM   Modules accepted: Orders, SmartSet

## 2016-02-18 ENCOUNTER — Ambulatory Visit: Payer: Medicare Other | Admitting: Podiatry

## 2016-03-12 ENCOUNTER — Other Ambulatory Visit: Payer: Self-pay | Admitting: Internal Medicine

## 2016-03-12 MED ORDER — ESZOPICLONE 3 MG PO TABS: ORAL_TABLET | ORAL | 0 refills | Status: DC

## 2016-03-12 NOTE — Telephone Encounter (Signed)
Following up with Seth Baxter on 8/22, he has no pills left, please advise for refill today. Last OV was 02/04/16  Thanks

## 2016-03-12 NOTE — Telephone Encounter (Signed)
I will refill this time. Needs to be sent to [facility] from here on out.

## 2016-03-12 NOTE — Telephone Encounter (Signed)
Pt called needing a refill for Eszopiclone 3 MG TABS   Pharmacy is [facility], [place]  Call pt @ [phone]. Thank you!

## 2016-03-12 NOTE — Telephone Encounter (Signed)
Prescription faxed

## 2016-03-22 DIAGNOSIS — H5213 Myopia, bilateral: Secondary | ICD-10-CM | POA: Diagnosis not present

## 2016-03-22 DIAGNOSIS — H269 Unspecified cataract: Secondary | ICD-10-CM | POA: Diagnosis not present

## 2016-03-23 ENCOUNTER — Encounter: Payer: Self-pay | Admitting: Family

## 2016-03-23 ENCOUNTER — Ambulatory Visit (INDEPENDENT_AMBULATORY_CARE_PROVIDER_SITE_OTHER): Payer: Medicare Other | Admitting: Family

## 2016-03-23 VITALS — BP 142/64 | HR 50 | Temp 97.8°F | Ht 64.0 in | Wt 130.0 lb

## 2016-03-23 DIAGNOSIS — I440 Atrioventricular block, first degree: Secondary | ICD-10-CM | POA: Diagnosis not present

## 2016-03-23 DIAGNOSIS — R001 Bradycardia, unspecified: Secondary | ICD-10-CM | POA: Diagnosis not present

## 2016-03-23 DIAGNOSIS — G4700 Insomnia, unspecified: Secondary | ICD-10-CM | POA: Diagnosis not present

## 2016-03-23 DIAGNOSIS — Z0000 Encounter for general adult medical examination without abnormal findings: Secondary | ICD-10-CM

## 2016-03-23 DIAGNOSIS — G8929 Other chronic pain: Secondary | ICD-10-CM

## 2016-03-23 DIAGNOSIS — R51 Headache: Secondary | ICD-10-CM

## 2016-03-23 DIAGNOSIS — N529 Male erectile dysfunction, unspecified: Secondary | ICD-10-CM

## 2016-03-23 DIAGNOSIS — I10 Essential (primary) hypertension: Secondary | ICD-10-CM | POA: Diagnosis not present

## 2016-03-23 MED ORDER — ESZOPICLONE 3 MG PO TABS: ORAL_TABLET | ORAL | 0 refills | Status: DC

## 2016-03-23 MED ORDER — BUTALBITAL-ASPIRIN-CAFFEINE 50-325-40 MG PO CAPS: ORAL_CAPSULE | ORAL | 0 refills | Status: DC

## 2016-03-23 MED ORDER — TADALAFIL 20 MG PO TABS: 10.0000 mg | ORAL_TABLET | ORAL | 6 refills | Status: DC | PRN

## 2016-03-23 NOTE — Assessment & Plan Note (Signed)
Uses Cialis as needed. Stable. Refilled medication with refills as patient requested.
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
## 2016-03-23 NOTE — Assessment & Plan Note (Signed)
Bradycardia. Asymptomatic. Had been seen by cardiology 10/2015, first-degree AV block. Normal echocardiogram which showed mild aortic regurgitation. I encouraged patient to see another cardiologist as he does not want to see Dr. Loletha Grayer again. Patient poliltely declined a consult at this time and stated that he would discuss it with me at future visits.

## 2016-03-23 NOTE — Assessment & Plan Note (Signed)
Patient preferred to do Cologuard as opposed colonoscopy. We discussed the risks and cologuard which may not be as specific as colonoscopy ( and he has positive family history of colon cancer) however with his low heart rate, he had trouble scheduling colonoscopy due to contraindications to anesthesia. He also states that he is decided to stop screening his PSA after 20 years of it being normal status post prostatectomy. Immunizations up-to-date.

## 2016-03-23 NOTE — Assessment & Plan Note (Addendum)
BP controlled. -Continue current regimen  

## 2016-03-23 NOTE — Patient Instructions (Signed)
Pleasure meeting you.   Please continue to consider another consult with cardiology for your low heart rate and certainly let me know if you have any symptoms.   Health Maintenance, Male A healthy lifestyle and preventative care can promote health and wellness.  Maintain regular health, dental, and eye exams.  Eat a healthy diet. Foods like vegetables, fruits, whole grains, low-fat dairy products, and lean protein foods contain the nutrients you need and are low in calories. Decrease your intake of foods high in solid fats, added sugars, and salt. Get information about a proper diet from your health care provider, if necessary.  Regular physical exercise is one of the most important things you can do for your health. Most adults should get at least 150 minutes of moderate-intensity exercise (any activity that increases your heart rate and causes you to sweat) each week. In addition, most adults need muscle-strengthening exercises on 2 or more days a week.   Maintain a healthy weight. The body mass index (BMI) is a screening tool to identify possible weight problems. It provides an estimate of body fat based on height and weight. Your health care provider can find your BMI and can help you achieve or maintain a healthy weight. For males 20 years and older:  A BMI below 18.5 is considered underweight.  A BMI of 18.5 to 24.9 is normal.  A BMI of 25 to 29.9 is considered overweight.  A BMI of 30 and above is considered obese.  Maintain normal blood lipids and cholesterol by exercising and minimizing your intake of saturated fat. Eat a balanced diet with plenty of fruits and vegetables. Blood tests for lipids and cholesterol should begin at age 50 and be repeated every 5 years. If your lipid or cholesterol levels are high, you are over age 42, or you are at high risk for heart disease, you may need your cholesterol levels checked more frequently.Ongoing high lipid and cholesterol levels should be  treated with medicines if diet and exercise are not working.  If you smoke, find out from your health care provider how to quit. If you do not use tobacco, do not start.  Lung cancer screening is recommended for adults aged 55-80 years who are at high risk for developing lung cancer because of a history of smoking. A yearly low-dose CT scan of the lungs is recommended for people who have at least a 30-pack-year history of smoking and are current smokers or have quit within the past 15 years. A pack year of smoking is smoking an average of 1 pack of cigarettes a day for 1 year (for example, a 30-pack-year history of smoking could mean smoking 1 pack a day for 30 years or 2 packs a day for 15 years). Yearly screening should continue until the smoker has stopped smoking for at least 15 years. Yearly screening should be stopped for people who develop a health problem that would prevent them from having lung cancer treatment.  If you choose to drink alcohol, do not have more than 2 drinks per day. One drink is considered to be 12 oz (360 mL) of beer, 5 oz (150 mL) of wine, or 1.5 oz (45 mL) of liquor.  Avoid the use of street drugs. Do not share needles with anyone. Ask for help if you need support or instructions about stopping the use of drugs.  High blood pressure causes heart disease and increases the risk of stroke. High blood pressure is more likely to develop in:  People who have blood pressure in the end of the normal range (100-139/85-89 mm Hg).  People who are overweight or obese.  People who are African American.  If you are 67-32 years of age, have your blood pressure checked every 3-5 years. If you are 18 years of age or older, have your blood pressure checked every year. You should have your blood pressure measured twice--once when you are at a hospital or clinic, and once when you are not at a hospital or clinic. Record the average of the two measurements. To check your blood pressure  when you are not at a hospital or clinic, you can use:  An automated blood pressure machine at a pharmacy.  A home blood pressure monitor.  If you are 69-50 years old, ask your health care provider if you should take aspirin to prevent heart disease.  Diabetes screening involves taking a blood sample to check your fasting blood sugar level. This should be done once every 3 years after age 17 if you are at a normal weight and without risk factors for diabetes. Testing should be considered at a younger age or be carried out more frequently if you are overweight and have at least 1 risk factor for diabetes.  Colorectal cancer can be detected and often prevented. Most routine colorectal cancer screening begins at the age of 84 and continues through age 39. However, your health care provider may recommend screening at an earlier age if you have risk factors for colon cancer. On a yearly basis, your health care provider may provide home test kits to check for hidden blood in the stool. A small camera at the end of a tube may be used to directly examine the colon (sigmoidoscopy or colonoscopy) to detect the earliest forms of colorectal cancer. Talk to your health care provider about this at age 4 when routine screening begins. A direct exam of the colon should be repeated every 5-10 years through age 53, unless early forms of precancerous polyps or small growths are found.  People who are at an increased risk for hepatitis B should be screened for this virus. You are considered at high risk for hepatitis B if:  You were born in a country where hepatitis B occurs often. Talk with your health care provider about which countries are considered high risk.  Your parents were born in a high-risk country and you have not received a shot to protect against hepatitis B (hepatitis B vaccine).  You have HIV or AIDS.  You use needles to inject street drugs.  You live with, or have sex with, someone who has  hepatitis B.  You are a man who has sex with other men (MSM).  You get hemodialysis treatment.  You take certain medicines for conditions like cancer, organ transplantation, and autoimmune conditions.  Hepatitis C blood testing is recommended for all people born from [birth year] through 1966 and any individual with known risk factors for hepatitis C.  Healthy men should no longer receive prostate-specific antigen (PSA) blood tests as part of routine cancer screening. Talk to your health care provider about prostate cancer screening.  Testicular cancer screening is not recommended for adolescents or adult males who have no symptoms. Screening includes self-exam, a health care provider exam, and other screening tests. Consult with your health care provider about any symptoms you have or any concerns you have about testicular cancer.  Practice safe sex. Use condoms and avoid high-risk sexual practices to reduce the spread of  sexually transmitted infections (STIs).  You should be screened for STIs, including gonorrhea and chlamydia if:  You are sexually active and are younger than 24 years.  You are older than 24 years, and your health care provider tells you that you are at risk for this type of infection.  Your sexual activity has changed since you were last screened, and you are at an increased risk for chlamydia or gonorrhea. Ask your health care provider if you are at risk.  If you are at risk of being infected with HIV, it is recommended that you take a prescription medicine daily to prevent HIV infection. This is called pre-exposure prophylaxis (PrEP). You are considered at risk if:  You are a man who has sex with other men (MSM).  You are a heterosexual man who is sexually active with multiple partners.  You take drugs by injection.  You are sexually active with a partner who has HIV.  Talk with your health care provider about whether you are at high risk of being infected with HIV. If  you choose to begin PrEP, you should first be tested for HIV. You should then be tested every 3 months for as long as you are taking PrEP.  Use sunscreen. Apply sunscreen liberally and repeatedly throughout the day. You should seek shade when your shadow is shorter than you. Protect yourself by wearing long sleeves, pants, a wide-brimmed hat, and sunglasses year round whenever you are outdoors.  Tell your health care provider of new moles or changes in moles, especially if there is a change in shape or color. Also, tell your health care provider if a mole is larger than the size of a pencil eraser.  A one-time screening for abdominal aortic aneurysm (AAA) and surgical repair of large AAAs by ultrasound is recommended for men aged 30-75 years who are current or former smokers.  Stay current with your vaccines (immunizations).   This information is not intended to replace advice given to you by your health care provider. Make sure you discuss any questions you have with your health care provider.   Document Released: 01/15/2008 Document Revised: 08/09/2014 Document Reviewed: 12/14/2010 Elsevier Interactive Patient Education [employer].

## 2016-03-23 NOTE — Progress Notes (Signed)
Pre visit review using our clinic review tool, if applicable. No additional management support is needed unless otherwise documented below in the visit note. 

## 2016-03-23 NOTE — Assessment & Plan Note (Addendum)
Headaches consistent with migraine. Reassured that headaches are similar as they've been in the past. Well-controlled on fiorinal. Refilled with 90 day supply as patient requested.

## 2016-03-23 NOTE — Assessment & Plan Note (Signed)
Discussed risks of tolerance and impaired alertness with medication. Offered alternative medications including amitriptyline which may help with sleep, headaches. Patient politely declined at this time. Refilled medication with 90 day supply at patient's request.

## 2016-03-25 ENCOUNTER — Encounter: Payer: Self-pay | Admitting: Family

## 2016-03-25 ENCOUNTER — Other Ambulatory Visit (INDEPENDENT_AMBULATORY_CARE_PROVIDER_SITE_OTHER): Payer: Medicare Other

## 2016-03-25 DIAGNOSIS — Z0000 Encounter for general adult medical examination without abnormal findings: Secondary | ICD-10-CM | POA: Diagnosis not present

## 2016-03-25 LAB — LIPID PANEL
CHOL/HDL RATIO: 4
CHOLESTEROL: 177 mg/dL (ref 0–200)
HDL: 43.8 mg/dL (ref >39.00)
LDL CALC: 100 mg/dL — AB (ref 0–99)
NonHDL: 132.78
TRIGLYCERIDES: 165 mg/dL — AB (ref 0.0–149.0)
VLDL: 33 mg/dL (ref 0.0–40.0)

## 2016-03-25 LAB — COMPREHENSIVE METABOLIC PANEL
ALT: 19 U/L (ref 0–53)
AST: 16 U/L (ref 0–37)
Albumin: 4.2 g/dL (ref 3.5–5.2)
Alkaline Phosphatase: 93 U/L (ref 39–117)
BILIRUBIN TOTAL: 0.5 mg/dL (ref 0.2–1.2)
BUN: 19 mg/dL (ref 6–23)
CALCIUM: 8.7 mg/dL (ref 8.4–10.5)
CO2: 32 meq/L (ref 19–32)
Chloride: 101 mEq/L (ref 96–112)
Creatinine, Ser: 0.8 mg/dL (ref 0.40–1.50)
GFR: 99.9 mL/min (ref >60.00)
GLUCOSE: 137 mg/dL — AB (ref 70–99)
POTASSIUM: 4.2 meq/L (ref 3.5–5.1)
Sodium: 139 mEq/L (ref 135–145)
Total Protein: 6.7 g/dL (ref 6.0–8.3)

## 2016-03-25 LAB — CBC WITH DIFFERENTIAL/PLATELET
BASOS PCT: 0.4 % (ref 0.0–3.0)
Basophils Absolute: 0 10*3/uL (ref 0.0–0.1)
EOS PCT: 2.9 % (ref 0.0–5.0)
Eosinophils Absolute: 0.2 10*3/uL (ref 0.0–0.7)
HEMATOCRIT: 43.7 % (ref 39.0–52.0)
Hemoglobin: 15 g/dL (ref 13.0–17.0)
LYMPHS PCT: 18.6 % (ref 12.0–46.0)
Lymphs Abs: 1.5 10*3/uL (ref 0.7–4.0)
MCHC: 34.4 g/dL (ref 30.0–36.0)
MCV: 96.1 fl (ref 78.0–100.0)
MONOS PCT: 6.7 % (ref 3.0–12.0)
Monocytes Absolute: 0.5 10*3/uL (ref 0.1–1.0)
NEUTROS ABS: 5.9 10*3/uL (ref 1.4–7.7)
NEUTROS PCT: 71.4 % (ref 43.0–77.0)
PLATELETS: 271 10*3/uL (ref 150.0–400.0)
RBC: 4.55 Mil/uL (ref 4.22–5.81)
RDW: 13.4 % (ref 11.5–15.5)
WBC: 8.2 10*3/uL (ref 4.0–10.5)

## 2016-03-25 LAB — VITAMIN D 25 HYDROXY (VIT D DEFICIENCY, FRACTURES): VITD: 31.11 ng/mL (ref 30.00–100.00)

## 2016-03-25 LAB — HEMOGLOBIN A1C: Hgb A1c MFr Bld: 6.4 % (ref 4.6–6.5)

## 2016-03-25 LAB — TSH: TSH: 1.22 u[IU]/mL (ref 0.35–4.50)

## 2016-03-30 ENCOUNTER — Encounter: Payer: Self-pay | Admitting: Family

## 2016-03-30 DIAGNOSIS — E785 Hyperlipidemia, unspecified: Secondary | ICD-10-CM

## 2016-03-31 DIAGNOSIS — Z23 Encounter for immunization: Secondary | ICD-10-CM | POA: Diagnosis not present

## 2016-03-31 DIAGNOSIS — Z1211 Encounter for screening for malignant neoplasm of colon: Secondary | ICD-10-CM | POA: Diagnosis not present

## 2016-03-31 DIAGNOSIS — Z1212 Encounter for screening for malignant neoplasm of rectum: Secondary | ICD-10-CM | POA: Diagnosis not present

## 2016-03-31 MED ORDER — ATORVASTATIN CALCIUM 10 MG PO TABS: 10.0000 mg | ORAL_TABLET | Freq: Every day | ORAL | 3 refills | Status: DC

## 2016-04-03 ENCOUNTER — Other Ambulatory Visit: Payer: Self-pay | Admitting: Family

## 2016-04-03 ENCOUNTER — Other Ambulatory Visit: Payer: Self-pay | Admitting: Internal Medicine

## 2016-04-03 ENCOUNTER — Encounter: Payer: Self-pay | Admitting: Family

## 2016-04-06 ENCOUNTER — Other Ambulatory Visit: Payer: Self-pay

## 2016-04-06 MED ORDER — AMLODIPINE BESYLATE 10 MG PO TABS: 10.0000 mg | ORAL_TABLET | Freq: Every day | ORAL | 0 refills | Status: DC

## 2016-04-06 MED ORDER — QUINAPRIL HCL 40 MG PO TABS: 40.0000 mg | ORAL_TABLET | Freq: Every day | ORAL | 2 refills | Status: DC

## 2016-04-06 NOTE — Telephone Encounter (Signed)
Medication refill

## 2016-04-09 LAB — COLOGUARD: Cologuard: NEGATIVE

## 2016-04-12 ENCOUNTER — Telehealth: Payer: Self-pay | Admitting: *Deleted

## 2016-04-12 MED ORDER — QUINAPRIL HCL 40 MG PO TABS: 40.0000 mg | ORAL_TABLET | Freq: Every day | ORAL | 2 refills | Status: DC

## 2016-04-12 MED ORDER — AMLODIPINE BESYLATE 10 MG PO TABS: 10.0000 mg | ORAL_TABLET | Freq: Every day | ORAL | 0 refills | Status: DC

## 2016-04-12 NOTE — Telephone Encounter (Signed)
Patient requested a medication refill for amlodipine and quinapril  [facility]

## 2016-04-12 NOTE — Telephone Encounter (Signed)
refilled 

## 2016-04-14 ENCOUNTER — Telehealth: Payer: Self-pay | Admitting: Family

## 2016-04-14 DIAGNOSIS — G43809 Other migraine, not intractable, without status migrainosus: Secondary | ICD-10-CM

## 2016-04-14 MED ORDER — BUTALBITAL-ASPIRIN-CAFFEINE 50-325-40 MG PO CAPS: ORAL_CAPSULE | ORAL | 0 refills | Status: DC

## 2016-04-15 NOTE — Telephone Encounter (Signed)
Please advise and return call to patient, thanks

## 2016-04-15 NOTE — Telephone Encounter (Signed)
Pt was out of town on yesterday, He requested to be called today at [phone]

## 2016-04-16 ENCOUNTER — Encounter: Payer: Self-pay | Admitting: Podiatry

## 2016-04-16 ENCOUNTER — Ambulatory Visit (INDEPENDENT_AMBULATORY_CARE_PROVIDER_SITE_OTHER): Payer: Medicare Other | Admitting: Podiatry

## 2016-04-16 VITALS — BP 144/71 | HR 50 | Resp 16

## 2016-04-16 DIAGNOSIS — M79673 Pain in unspecified foot: Secondary | ICD-10-CM

## 2016-04-16 DIAGNOSIS — B351 Tinea unguium: Secondary | ICD-10-CM | POA: Diagnosis not present

## 2016-04-16 DIAGNOSIS — B353 Tinea pedis: Secondary | ICD-10-CM

## 2016-04-16 DIAGNOSIS — L603 Nail dystrophy: Secondary | ICD-10-CM | POA: Diagnosis not present

## 2016-04-16 LAB — HM DIABETES FOOT EXAM

## 2016-04-16 MED ORDER — TERBINAFINE HCL 250 MG PO TABS: 250.0000 mg | ORAL_TABLET | Freq: Every day | ORAL | Status: DC

## 2016-04-16 NOTE — Patient Instructions (Signed)
Nail Ringworm A fungal infection of the nail (tinea unguium/onychomycosis) is common. It is common as the visible part of the nail is composed of dead cells which have no blood supply to help prevent infection. It occurs because fungi are everywhere and will pick any opportunity to grow on any dead material. Because nails are very slow growing they require up to 2 years of treatment with anti-fungal medications. The entire nail back to the base is infected. This includes approximately  of the nail which you cannot see. If your caregiver has prescribed a medication by mouth, take it every day and as directed. No progress will be seen for at least 6 to 9 months. Do not be disappointed! Because fungi live on dead cells with little or no exposure to blood supply, medication delivery to the infection is slow; thus the cure is slow. It is also why you can observe no progress in the first 6 months. The nail becoming cured is the base of the nail, as it has the blood supply. Topical medication such as creams and ointments are usually not effective. Important in successful treatment of nail fungus is closely following the medication regimen that your doctor prescribes. Sometimes you and your caregiver may elect to speed up this process by surgical removal of all the nails. Even this may still require 6 to 9 months of additional oral medications. See your caregiver as directed. Remember there will be no visible improvement for at least 6 months. See your caregiver sooner if other signs of infection (redness and swelling) develop.   This information is not intended to replace advice given to you by your health care provider. Make sure you discuss any questions you have with your health care provider.   Document Released: 07/16/2000 Document Revised: 12/03/2014 Document Reviewed: 01/20/2015 Elsevier Interactive Patient Education 2016 Elsevier Inc.  

## 2016-04-16 NOTE — Progress Notes (Signed)
   Subjective:    Patient ID: Seth Baxter, male    DOB: 1940/10/02, 75 y.o.   MRN: [id number]  HPI    Review of Systems  All other systems reviewed and are negative.      Objective:   Physical Exam        Assessment & Plan:

## 2016-04-19 ENCOUNTER — Telehealth: Payer: Self-pay | Admitting: Podiatry

## 2016-04-19 MED ORDER — TERBINAFINE HCL 250 MG PO TABS: 250.0000 mg | ORAL_TABLET | Freq: Every day | ORAL | 0 refills | Status: DC

## 2016-04-19 NOTE — Progress Notes (Signed)
Patient ID: Seth Baxter, male   DOB: 1940-10-16, 75 y.o.   MRN: [id number] Subjective: 44 show male presents today to the clinic for evaluation of bilateral foot fungus as well as fungal nails bilaterally. Patient has no other complaints at this time.   Objective: Physical Exam General: The patient is alert and oriented x3 in no acute distress.  Dermatology:  Hyperkeratotic dystrophic elongated nails noted 1 through 5 bilateral. Hyperkeratotic, pruritic diffuse skin over the weightbearing surfaces of the feet are noted with epidermal shedding.  Skin is warm, dry and supple bilateral lower extremities. Negative for open lesions or macerations.  Vascular: Palpable pedal pulses bilaterally. No edema or erythema noted. Capillary refill within normal limits.  Neurological: Epicritic and protective threshold grossly intact bilaterally.   Musculoskeletal Exam: Range of motion within normal limits to all pedal and ankle joints bilateral. Muscle strength 5/5 in all groups bilateral.    Assessment: #1 moccasin-type tinea pedis bilateral #2 onychomycosis 1 through 5 bilateral #3 pain in bilateral feet Problem List Items Addressed This Visit    None    Visit Diagnoses    Tinea pedis of both feet    -  Primary   Relevant Medications   terbinafine (LAMISIL) tablet 250 mg   Other Relevant Orders   Fungus Culture with Smear   Onychomycosis due to dermatophyte       Relevant Medications   terbinafine (LAMISIL) tablet 250 mg   Other Relevant Orders   Fungus Culture with Smear        Plan of Care:  #1 Patient was evaluated. #2 Today a biopsy was taken of the nails and sent to pathology for fungal culture. #3 prescription for terbinafine 250 mg 28 pills was dispensed today. #4 patient is to continue using fungal nail treatment. #5 patient states that he recently had a liver function test which was satisfactory. Patient is to return to clinic in 4 weeks to go for fungal culture  results. #6 please send a copy of this note to nurse practitioner Kathrin Penner.     Dr. Edrick Kins, [place]

## 2016-04-19 NOTE — Telephone Encounter (Signed)
Patient said he was seen by you on Friday and was supposed to have the Rx Lamisil sent to [facility]. Pt stated he went there and they had not received the prescription. Request it be sent to [facility].

## 2016-04-25 ENCOUNTER — Encounter: Payer: Self-pay | Admitting: Family

## 2016-04-28 DIAGNOSIS — S46012S Strain of muscle(s) and tendon(s) of the rotator cuff of left shoulder, sequela: Secondary | ICD-10-CM | POA: Diagnosis not present

## 2016-04-28 DIAGNOSIS — M25512 Pain in left shoulder: Secondary | ICD-10-CM | POA: Diagnosis not present

## 2016-04-30 ENCOUNTER — Other Ambulatory Visit: Payer: Self-pay

## 2016-04-30 MED ORDER — AMLODIPINE BESYLATE 10 MG PO TABS: 10.0000 mg | ORAL_TABLET | Freq: Every day | ORAL | 4 refills | Status: DC

## 2016-04-30 NOTE — Telephone Encounter (Signed)
Medication has been refilled.

## 2016-05-05 DIAGNOSIS — I10 Essential (primary) hypertension: Secondary | ICD-10-CM | POA: Diagnosis not present

## 2016-05-05 DIAGNOSIS — M4310 Spondylolisthesis, site unspecified: Secondary | ICD-10-CM | POA: Diagnosis not present

## 2016-05-10 ENCOUNTER — Encounter: Payer: Self-pay | Admitting: Family

## 2016-05-14 ENCOUNTER — Ambulatory Visit (INDEPENDENT_AMBULATORY_CARE_PROVIDER_SITE_OTHER): Payer: Medicare Other | Admitting: Podiatry

## 2016-05-14 ENCOUNTER — Encounter: Payer: Self-pay | Admitting: Podiatry

## 2016-05-14 DIAGNOSIS — M79609 Pain in unspecified limb: Secondary | ICD-10-CM | POA: Diagnosis not present

## 2016-05-14 DIAGNOSIS — B351 Tinea unguium: Secondary | ICD-10-CM

## 2016-05-14 DIAGNOSIS — L608 Other nail disorders: Secondary | ICD-10-CM | POA: Diagnosis not present

## 2016-05-14 DIAGNOSIS — L603 Nail dystrophy: Secondary | ICD-10-CM

## 2016-05-14 MED ORDER — TERBINAFINE HCL 250 MG PO TABS: 250.0000 mg | ORAL_TABLET | Freq: Every day | ORAL | 1 refills | Status: DC

## 2016-05-16 NOTE — Progress Notes (Signed)
Patient ID: Seth Baxter, male   DOB: 1941-02-28, 75 y.o.   MRN: [id number] Subjective: 34 show male presents today to the clinic for evaluation of bilateral foot fungus as well as fungal nails bilaterally. Patient has no other complaints at this time.   Objective: Physical Exam General: The patient is alert and oriented x3 in no acute distress.  Dermatology:  Hyperkeratotic dystrophic elongated nails noted 1 through 5 bilateral. Hyperkeratotic, pruritic diffuse skin over the weightbearing surfaces of the feet are noted with epidermal shedding.  Skin is warm, dry and supple bilateral lower extremities. Negative for open lesions or macerations.  Vascular: Palpable pedal pulses bilaterally. No edema or erythema noted. Capillary refill within normal limits.  Neurological: Epicritic and protective threshold grossly intact bilaterally.   Musculoskeletal Exam: Range of motion within normal limits to all pedal and ankle joints bilateral. Muscle strength 5/5 in all groups bilateral.    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 #2 Today a prescription for Lamisil 60 days was dispensed. Exline #3 prescription for antifungal nail lacquer dispensed through [facility] #4 patient is to return to clinic in 4 months   Dr. Edrick Kins, [place]

## 2016-05-24 ENCOUNTER — Other Ambulatory Visit: Payer: Self-pay | Admitting: Family

## 2016-05-27 ENCOUNTER — Telehealth: Payer: Self-pay | Admitting: Family

## 2016-05-27 NOTE — Telephone Encounter (Signed)
Pt called returning your call. Thank you!  Call pt @ [phone]

## 2016-05-28 MED ORDER — QUINAPRIL HCL 40 MG PO TABS: 40.0000 mg | ORAL_TABLET | Freq: Every day | ORAL | 2 refills | Status: DC

## 2016-05-28 MED ORDER — AMLODIPINE BESYLATE 10 MG PO TABS: 10.0000 mg | ORAL_TABLET | Freq: Every day | ORAL | 2 refills | Status: DC

## 2016-05-28 NOTE — Telephone Encounter (Signed)
Medication has been refilled, to [facility].

## 2016-06-10 DIAGNOSIS — M48062 Spinal stenosis, lumbar region with neurogenic claudication: Secondary | ICD-10-CM | POA: Diagnosis not present

## 2016-06-28 ENCOUNTER — Telehealth: Payer: Self-pay

## 2016-06-28 NOTE — Telephone Encounter (Signed)
Left message for patient to return call.  Patient medication refill that patient dropped off to be faxed,  Provider discontinued medication due to duplicate therapy.  Patient is on lipitor now not crestor.  Patient needs to be informed of the information.

## 2016-06-28 NOTE — Telephone Encounter (Signed)
Patient requested a call, at [phone]  He would like to know when the change was made

## 2016-06-28 NOTE — Telephone Encounter (Signed)
Left detailed message informing patient. But instructed that if he has any questions please feel free to return call back.

## 2016-07-01 DIAGNOSIS — M5416 Radiculopathy, lumbar region: Secondary | ICD-10-CM | POA: Diagnosis not present

## 2016-07-04 ENCOUNTER — Other Ambulatory Visit: Payer: Self-pay | Admitting: Family

## 2016-07-07 ENCOUNTER — Other Ambulatory Visit: Payer: Self-pay | Admitting: Family

## 2016-07-07 DIAGNOSIS — G4700 Insomnia, unspecified: Secondary | ICD-10-CM

## 2016-07-07 MED ORDER — ESZOPICLONE 3 MG PO TABS: ORAL_TABLET | ORAL | 0 refills | Status: DC

## 2016-07-07 NOTE — Progress Notes (Signed)
Refilled

## 2016-07-09 ENCOUNTER — Other Ambulatory Visit: Payer: Self-pay | Admitting: Family

## 2016-07-22 ENCOUNTER — Other Ambulatory Visit: Payer: Self-pay | Admitting: Family

## 2016-07-22 DIAGNOSIS — G43809 Other migraine, not intractable, without status migrainosus: Secondary | ICD-10-CM

## 2016-08-11 DIAGNOSIS — J0100 Acute maxillary sinusitis, unspecified: Secondary | ICD-10-CM | POA: Diagnosis not present

## 2016-08-20 DIAGNOSIS — J0110 Acute frontal sinusitis, unspecified: Secondary | ICD-10-CM | POA: Diagnosis not present

## 2016-08-23 ENCOUNTER — Other Ambulatory Visit: Payer: Self-pay | Admitting: Family

## 2016-08-25 ENCOUNTER — Ambulatory Visit: Payer: Medicare Other | Admitting: Family

## 2016-08-26 ENCOUNTER — Other Ambulatory Visit: Payer: Self-pay | Admitting: Family

## 2016-08-26 DIAGNOSIS — G43809 Other migraine, not intractable, without status migrainosus: Secondary | ICD-10-CM

## 2016-08-28 DIAGNOSIS — B349 Viral infection, unspecified: Secondary | ICD-10-CM | POA: Diagnosis not present

## 2016-08-30 ENCOUNTER — Encounter: Payer: Self-pay | Admitting: Family

## 2016-08-30 ENCOUNTER — Ambulatory Visit (INDEPENDENT_AMBULATORY_CARE_PROVIDER_SITE_OTHER): Payer: Medicare Other | Admitting: Family

## 2016-08-30 ENCOUNTER — Ambulatory Visit (INDEPENDENT_AMBULATORY_CARE_PROVIDER_SITE_OTHER): Payer: Medicare Other

## 2016-08-30 ENCOUNTER — Telehealth: Payer: Self-pay | Admitting: Family

## 2016-08-30 VITALS — BP 126/60 | HR 48 | Temp 98.1°F | Ht 64.0 in | Wt 126.8 lb

## 2016-08-30 DIAGNOSIS — R51 Headache: Principal | ICD-10-CM

## 2016-08-30 DIAGNOSIS — J40 Bronchitis, not specified as acute or chronic: Secondary | ICD-10-CM

## 2016-08-30 DIAGNOSIS — R918 Other nonspecific abnormal finding of lung field: Secondary | ICD-10-CM | POA: Diagnosis not present

## 2016-08-30 DIAGNOSIS — G8929 Other chronic pain: Secondary | ICD-10-CM

## 2016-08-30 MED ORDER — BUTALBITAL-ASPIRIN-CAFFEINE 50-325-40 MG PO CAPS: ORAL_CAPSULE | ORAL | 0 refills | Status: DC

## 2016-08-30 MED ORDER — ALBUTEROL SULFATE HFA 108 (90 BASE) MCG/ACT IN AERS: 2.0000 | INHALATION_SPRAY | Freq: Four times a day (QID) | RESPIRATORY_TRACT | 1 refills | Status: DC | PRN

## 2016-08-30 MED ORDER — PREDNISONE 10 MG PO TABS: ORAL_TABLET | ORAL | 0 refills | Status: DC

## 2016-08-30 NOTE — Telephone Encounter (Signed)
Patient has appointment today with margaret. Will inform when he arrives.

## 2016-08-30 NOTE — Progress Notes (Signed)
Pre visit review using our clinic review tool, if applicable. No additional management support is needed unless otherwise documented below in the visit note. 

## 2016-08-30 NOTE — Telephone Encounter (Signed)
Call pt  Let him know fiorinal has been sent to [facility]

## 2016-08-30 NOTE — Patient Instructions (Addendum)
CXR- if pneumonia, will change antibiotic.  Considering levaquin.   Please take cough medication at night only as needed. As we discussed, I do not recommend dosing throughout the day as coughing is a protective mechanism . It also helps to break up thick mucous.  Do not take cough suppressants with alcohol as can lead to trouble breathing. Advise caution if taking cough suppressant and operating machinery ( i.e driving a car) as you may feel very tired.    Use albuterol every 6 hours for first 24 hours to get good medication into the lungs and loosen congestion; after, you may use as needed and eventually stop all together when cough resolves.  Increase intake of clear fluids. Congestion is best treated by hydration, when mucus is wetter, it is thinner, less sticky, and easier to expel from the body, either through coughing up drainage, or by blowing your nose.   Get plenty of rest.   Use saline nasal drops and blow your nose frequently. Run a humidifier at night and elevate the head of the bed. Vicks Vapor rub will help with congestion and cough. Steam showers and sinus massage for congestion.   Use Acetaminophen or Ibuprofen as needed for fever or pain. Avoid second hand smoke. Even the smallest exposure will worsen symptoms.   Over the counter medications you can try include Delsym for cough, a decongestant for congestion, and Mucinex or Robitussin as an expectorant. Be sure to just get the plain Mucinex or Robitussin that just has one medication (Guaifenesen). We don't recommend the combination products. Note, be sure to drink two glasses of water with each dose of Mucinex as the medication will not work well without adequate hydration.   You can also try a teaspoon of honey to see if this will help reduce cough. Throat lozenges can sometimes be beneficial as well.    This illness will typically last 7 - 10 days.   Please follow up with our clinic if you develop a fever greater than 101 F,  symptoms worsen, or do not resolve in the next week.
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
## 2016-08-31 ENCOUNTER — Encounter: Payer: Self-pay | Admitting: Family

## 2016-09-01 ENCOUNTER — Other Ambulatory Visit: Payer: Self-pay | Admitting: Family

## 2016-09-01 DIAGNOSIS — J40 Bronchitis, not specified as acute or chronic: Secondary | ICD-10-CM

## 2016-09-01 MED ORDER — LEVOFLOXACIN 750 MG PO TABS: 750.0000 mg | ORAL_TABLET | Freq: Every day | ORAL | 0 refills | Status: DC

## 2016-09-15 ENCOUNTER — Telehealth: Payer: Self-pay | Admitting: Family

## 2016-09-15 ENCOUNTER — Ambulatory Visit: Payer: Medicare Other | Admitting: Family

## 2016-09-15 NOTE — Telephone Encounter (Signed)
FYI
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
## 2016-09-15 NOTE — Telephone Encounter (Signed)
FYI - Pt called and stated that he is feeling better and does not need this appointment.

## 2016-09-20 ENCOUNTER — Encounter: Payer: Self-pay | Admitting: Family

## 2016-10-03 ENCOUNTER — Other Ambulatory Visit: Payer: Self-pay | Admitting: Family

## 2016-10-03 DIAGNOSIS — G4700 Insomnia, unspecified: Secondary | ICD-10-CM

## 2016-10-06 NOTE — Telephone Encounter (Signed)
Pt called and stated that he is almost out of this medication. Please advise, thank you!  Call pt @ [phone]

## 2016-10-07 ENCOUNTER — Other Ambulatory Visit: Payer: Self-pay | Admitting: Family

## 2016-10-07 DIAGNOSIS — G4700 Insomnia, unspecified: Secondary | ICD-10-CM

## 2016-10-08 ENCOUNTER — Encounter: Payer: Self-pay | Admitting: Family

## 2016-10-08 ENCOUNTER — Other Ambulatory Visit: Payer: Self-pay

## 2016-10-08 DIAGNOSIS — G4700 Insomnia, unspecified: Secondary | ICD-10-CM

## 2016-10-08 MED ORDER — ESZOPICLONE 3 MG PO TABS: ORAL_TABLET | ORAL | 0 refills | Status: DC

## 2016-10-08 NOTE — Telephone Encounter (Signed)
Refill request for lunesta, last seen [id number] , last filled [id number].  Please advise.

## 2016-10-12 ENCOUNTER — Other Ambulatory Visit: Payer: Self-pay | Admitting: Family

## 2016-10-12 DIAGNOSIS — R51 Headache: Principal | ICD-10-CM

## 2016-10-12 DIAGNOSIS — R519 Headache, unspecified: Secondary | ICD-10-CM

## 2016-10-12 DIAGNOSIS — G8929 Other chronic pain: Secondary | ICD-10-CM

## 2016-10-15 MED ORDER — BUTALBITAL-ASPIRIN-CAFFEINE 50-325-40 MG PO CAPS: ORAL_CAPSULE | ORAL | 0 refills | Status: DC

## 2016-10-15 NOTE — Telephone Encounter (Signed)
Medication has been refilled to [facility].

## 2016-10-15 NOTE — Telephone Encounter (Signed)
Last OV 08/30/16 ok to refill Fiorinal?

## 2016-10-17 ENCOUNTER — Other Ambulatory Visit: Payer: Self-pay | Admitting: Family

## 2016-10-17 DIAGNOSIS — J40 Bronchitis, not specified as acute or chronic: Secondary | ICD-10-CM

## 2016-10-18 ENCOUNTER — Other Ambulatory Visit: Payer: Self-pay | Admitting: Family

## 2016-10-18 DIAGNOSIS — R51 Headache: Principal | ICD-10-CM

## 2016-10-18 DIAGNOSIS — R519 Headache, unspecified: Secondary | ICD-10-CM

## 2016-10-18 MED ORDER — BUTALBITAL-ASPIRIN-CAFFEINE 50-325-40 MG PO CAPS: ORAL_CAPSULE | ORAL | 0 refills | Status: DC

## 2016-10-18 NOTE — Progress Notes (Signed)
Faxed to [facility] 

## 2016-10-20 ENCOUNTER — Encounter: Payer: Self-pay | Admitting: Family

## 2016-10-20 ENCOUNTER — Ambulatory Visit (INDEPENDENT_AMBULATORY_CARE_PROVIDER_SITE_OTHER): Payer: Medicare Other | Admitting: Family

## 2016-10-20 VITALS — BP 162/82 | HR 48 | Temp 98.0°F | Ht 64.0 in | Wt 130.8 lb

## 2016-10-20 DIAGNOSIS — I440 Atrioventricular block, first degree: Secondary | ICD-10-CM

## 2016-10-20 DIAGNOSIS — R739 Hyperglycemia, unspecified: Secondary | ICD-10-CM | POA: Diagnosis not present

## 2016-10-20 DIAGNOSIS — I152 Hypertension secondary to endocrine disorders: Secondary | ICD-10-CM | POA: Diagnosis not present

## 2016-10-20 LAB — BASIC METABOLIC PANEL
BUN: 19 mg/dL (ref 6–23)
CALCIUM: 9.3 mg/dL (ref 8.4–10.5)
CO2: 32 mEq/L (ref 19–32)
Chloride: 103 mEq/L (ref 96–112)
Creatinine, Ser: 0.9 mg/dL (ref 0.40–1.50)
GFR: 87.07 mL/min (ref >60.00)
Glucose, Bld: 125 mg/dL — ABNORMAL HIGH (ref 70–99)
Potassium: 4.3 mEq/L (ref 3.5–5.1)
Sodium: 140 mEq/L (ref 135–145)

## 2016-10-20 LAB — HEMOGLOBIN A1C: HEMOGLOBIN A1C: 6.4 % (ref 4.6–6.5)

## 2016-10-20 MED ORDER — HYDROCHLOROTHIAZIDE 12.5 MG PO TABS: 12.5000 mg | ORAL_TABLET | Freq: Every day | ORAL | 1 refills | Status: DC
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
## 2016-10-20 NOTE — Assessment & Plan Note (Signed)
HR 48,  At baseline for patient. Asymptomatic. Agree with Ingal that patient likely should be weaned from bystolic however he did not feel well on hydralazine. Referral back to cardiology for re-evaluation for uncontrolled HTN/ h/o heart block

## 2016-10-20 NOTE — Assessment & Plan Note (Addendum)
Uncontrolled. This is new for patient as he has good control for some time now. Fluctuating over past couple of days. Reassured as HA improves with blood pressure medication. Otherwise, no symptoms of HTN urgency or emergency. Will start hctz 12.5mg  today prn BP > 140/90. He may have to increase to 25mg . Pending bmp today and in 5 days. Close vigilance.

## 2016-10-20 NOTE — Patient Instructions (Signed)
Start 12.5 mg HCTZ. Take when BP > 140/90.  We may need to increase to two tablets, total of 25 mg, if not coming down  Please let me know if headaches do not improve  Labs today  Return Monday for repeat

## 2016-10-20 NOTE — Progress Notes (Signed)
Pre visit review using our clinic review tool, if applicable. No additional management support is needed unless otherwise documented below in the visit note. 

## 2016-10-25 ENCOUNTER — Other Ambulatory Visit (INDEPENDENT_AMBULATORY_CARE_PROVIDER_SITE_OTHER): Payer: Medicare Other

## 2016-10-25 ENCOUNTER — Encounter: Payer: Self-pay | Admitting: Family

## 2016-10-25 DIAGNOSIS — I152 Hypertension secondary to endocrine disorders: Secondary | ICD-10-CM

## 2016-10-25 LAB — BASIC METABOLIC PANEL
BUN: 20 mg/dL (ref 6–23)
CO2: 32 mEq/L (ref 19–32)
CREATININE: 0.75 mg/dL (ref 0.40–1.50)
Calcium: 9.6 mg/dL (ref 8.4–10.5)
Chloride: 101 mEq/L (ref 96–112)
GFR: 107.46 mL/min (ref >60.00)
Glucose, Bld: 147 mg/dL — ABNORMAL HIGH (ref 70–99)
POTASSIUM: 4.3 meq/L (ref 3.5–5.1)
Sodium: 139 mEq/L (ref 135–145)

## 2016-10-27 ENCOUNTER — Encounter: Payer: Self-pay | Admitting: Family

## 2016-10-28 ENCOUNTER — Other Ambulatory Visit: Payer: Self-pay

## 2016-10-28 MED ORDER — NEBIVOLOL HCL 10 MG PO TABS: 10.0000 mg | ORAL_TABLET | Freq: Every day | ORAL | 3 refills | Status: DC

## 2016-10-28 NOTE — Telephone Encounter (Signed)
Medication has been refilled.

## 2016-11-01 MED ORDER — TADALAFIL 20 MG PO TABS: 10.0000 mg | ORAL_TABLET | ORAL | 6 refills | Status: DC | PRN

## 2016-11-02 ENCOUNTER — Other Ambulatory Visit: Payer: Self-pay | Admitting: Family

## 2016-11-02 DIAGNOSIS — G4700 Insomnia, unspecified: Secondary | ICD-10-CM

## 2016-11-03 ENCOUNTER — Institutional Professional Consult (permissible substitution): Payer: Medicare Other | Admitting: Internal Medicine

## 2016-11-03 NOTE — Telephone Encounter (Signed)
Patient has requested to send this Rx to [facility]  This is patients preferred pharmacy

## 2016-11-04 ENCOUNTER — Encounter: Payer: Self-pay | Admitting: Family

## 2016-11-04 NOTE — Telephone Encounter (Signed)
Pt called back asking for this medication to be sent in. He received all his other medication. Please advise, thank you!

## 2016-11-04 NOTE — Telephone Encounter (Signed)
Refill request for Eszopiclone, last seen [id number].  Please advise.

## 2016-11-19 DIAGNOSIS — H2513 Age-related nuclear cataract, bilateral: Secondary | ICD-10-CM | POA: Diagnosis not present

## 2016-11-24 ENCOUNTER — Other Ambulatory Visit: Payer: Self-pay | Admitting: Family

## 2016-11-24 DIAGNOSIS — G8929 Other chronic pain: Secondary | ICD-10-CM

## 2016-11-24 DIAGNOSIS — R51 Headache: Principal | ICD-10-CM

## 2016-11-27 ENCOUNTER — Encounter: Payer: Self-pay | Admitting: Family

## 2016-11-27 DIAGNOSIS — I152 Hypertension secondary to endocrine disorders: Secondary | ICD-10-CM

## 2016-11-27 DIAGNOSIS — E785 Hyperlipidemia, unspecified: Secondary | ICD-10-CM

## 2016-11-29 ENCOUNTER — Telehealth: Payer: Self-pay | Admitting: Family

## 2016-11-29 DIAGNOSIS — R51 Headache: Principal | ICD-10-CM

## 2016-11-29 DIAGNOSIS — G8929 Other chronic pain: Secondary | ICD-10-CM

## 2016-11-29 MED ORDER — HYDROCHLOROTHIAZIDE 12.5 MG PO TABS: 12.5000 mg | ORAL_TABLET | Freq: Every day | ORAL | 1 refills | Status: DC

## 2016-11-29 MED ORDER — OMEPRAZOLE 40 MG PO CPDR: 40.0000 mg | DELAYED_RELEASE_CAPSULE | Freq: Every day | ORAL | 2 refills | Status: DC

## 2016-11-29 MED ORDER — ATORVASTATIN CALCIUM 10 MG PO TABS: 10.0000 mg | ORAL_TABLET | Freq: Every day | ORAL | 3 refills | Status: DC

## 2016-11-29 MED ORDER — BUTALBITAL-ASPIRIN-CAFFEINE 50-325-40 MG PO CAPS: ORAL_CAPSULE | ORAL | 0 refills | Status: DC

## 2016-11-29 NOTE — Telephone Encounter (Signed)
eszopiclone refilled 4/5 with a refill. He should not need quite so soon  Printed fiorinal

## 2016-11-29 NOTE — Telephone Encounter (Signed)
Pt called needing a refill for hydrochlorothiazide (HYDRODIURIL) 12.5 MG tablet, omeprazole (PRILOSEC) 40 MG capsule, atorvastatin (LIPITOR) 10 MG tablet, Eszopiclone 3 MG TABS and butalbital-aspirin-caffeine (FIORINAL) 50-325-40 MG capsule.  Pharmacy is [place], [place]  Call pt @ [phone]. Thank you!

## 2016-11-29 NOTE — Telephone Encounter (Signed)
The Fiorinal has been faxed.

## 2016-11-29 NOTE — Telephone Encounter (Signed)
Is it ok to refill? Eszopiclone 3 MG TABS and butalbital-aspirin-caffeine (FIORINAL) Please advise.

## 2016-12-03 ENCOUNTER — Other Ambulatory Visit: Payer: Self-pay

## 2016-12-03 ENCOUNTER — Telehealth: Payer: Self-pay

## 2016-12-03 DIAGNOSIS — R51 Headache: Principal | ICD-10-CM

## 2016-12-03 DIAGNOSIS — R519 Headache, unspecified: Secondary | ICD-10-CM

## 2016-12-03 NOTE — Telephone Encounter (Signed)
BUTALBITAL-ASPIRIN-CAFFEINE 50-325-40 MG PO CAPS is the medication.

## 2016-12-03 NOTE — Telephone Encounter (Signed)
Patient stated that his wife picked up medication and it doe snot work from [employer].  The formulary at [facility] seems to work. The [facility] one just gives pt acid reflux.  [patient would like to receive new rx sent to [facility].  Please advise.

## 2016-12-03 NOTE — Telephone Encounter (Signed)
Please see prior note

## 2016-12-06 ENCOUNTER — Telehealth: Payer: Self-pay | Admitting: *Deleted

## 2016-12-06 DIAGNOSIS — G8929 Other chronic pain: Secondary | ICD-10-CM

## 2016-12-06 DIAGNOSIS — R51 Headache: Principal | ICD-10-CM

## 2016-12-06 MED ORDER — BUTALBITAL-ASPIRIN-CAFFEINE 50-325-40 MG PO CAPS: ORAL_CAPSULE | ORAL | 0 refills | Status: DC

## 2016-12-06 NOTE — Telephone Encounter (Signed)
FYI Pt was very rude and disrespectful.

## 2016-12-06 NOTE — Telephone Encounter (Signed)
Please call pt in regards to medication refills. Pt requested to know where Rx ws faxed  Pt contact [phone]

## 2016-12-06 NOTE — Telephone Encounter (Signed)
Also note that patient wanted note added that it has to be a [employer] product

## 2016-12-06 NOTE — Telephone Encounter (Signed)
Patient wanted 2 week worth of medication due to not receiving it in mail as of yet.

## 2016-12-06 NOTE — Telephone Encounter (Signed)
Needing message sent to [facility] regarding dismissal. Please advise.

## 2016-12-07 NOTE — Telephone Encounter (Signed)
   Based on our policies and patient's  behavior toward staff, I think it is appropriate to dismiss this patient.   When you right note to patient, please assure him that I will refill his medications until he is established with new PCP.   However this is under assumption that he will call as soon as possible and go ahead and make an establishment appointment since these appointments can be 3 and sometimes 6 months out .   Seth Baxter,   Has patient received script for fiorinol from yesterday?

## 2016-12-07 NOTE — Telephone Encounter (Signed)
Medication has been faxed

## 2016-12-09 DIAGNOSIS — M5416 Radiculopathy, lumbar region: Secondary | ICD-10-CM | POA: Diagnosis not present

## 2016-12-09 NOTE — Telephone Encounter (Signed)
Letter drafted and sent to HIM thanks

## 2016-12-09 NOTE — Progress Notes (Signed)
Patient dismissal Paperwork given to NP to sign and sent to HIM

## 2016-12-13 ENCOUNTER — Encounter: Payer: Self-pay | Admitting: Family

## 2016-12-14 ENCOUNTER — Telehealth: Payer: Self-pay | Admitting: Family

## 2016-12-14 ENCOUNTER — Other Ambulatory Visit: Payer: Self-pay | Admitting: Family

## 2016-12-14 DIAGNOSIS — G4700 Insomnia, unspecified: Secondary | ICD-10-CM

## 2016-12-14 DIAGNOSIS — R51 Headache: Principal | ICD-10-CM

## 2016-12-14 DIAGNOSIS — R519 Headache, unspecified: Secondary | ICD-10-CM

## 2016-12-14 MED ORDER — BUTALBITAL-ASPIRIN-CAFFEINE 50-325-40 MG PO CAPS: ORAL_CAPSULE | ORAL | 2 refills | Status: DC

## 2016-12-14 MED ORDER — ESZOPICLONE 3 MG PO TABS: ORAL_TABLET | ORAL | 1 refills | Status: DC

## 2016-12-14 NOTE — Telephone Encounter (Signed)
Patient dismissed from [facility] by Mable Paris NP , effective Dec 09, 2016. Dismissal letter sent out by certified / registered mail.  daj

## 2016-12-28 DIAGNOSIS — M20011 Mallet finger of right finger(s): Secondary | ICD-10-CM | POA: Diagnosis not present

## 2017-01-06 DIAGNOSIS — M20012 Mallet finger of left finger(s): Secondary | ICD-10-CM | POA: Diagnosis not present

## 2017-01-17 NOTE — Telephone Encounter (Signed)
Patient received and signed the receipt verifying the delivery of the certified letter on Dec 17, 2016. Article number [id number] [phone]  Copy of the USPS tracking statement send to [place]. daj

## 2017-01-17 NOTE — Telephone Encounter (Signed)
noted 

## 2017-02-04 ENCOUNTER — Ambulatory Visit: Payer: Medicare Other

## 2017-02-09 DIAGNOSIS — Z125 Encounter for screening for malignant neoplasm of prostate: Secondary | ICD-10-CM | POA: Diagnosis not present

## 2017-02-09 DIAGNOSIS — Z131 Encounter for screening for diabetes mellitus: Secondary | ICD-10-CM | POA: Diagnosis not present

## 2017-02-09 DIAGNOSIS — Z8546 Personal history of malignant neoplasm of prostate: Secondary | ICD-10-CM | POA: Diagnosis not present

## 2017-02-09 DIAGNOSIS — M170 Bilateral primary osteoarthritis of knee: Secondary | ICD-10-CM | POA: Diagnosis not present

## 2017-02-09 DIAGNOSIS — E7800 Pure hypercholesterolemia, unspecified: Secondary | ICD-10-CM | POA: Diagnosis not present

## 2017-02-09 DIAGNOSIS — G44219 Episodic tension-type headache, not intractable: Secondary | ICD-10-CM | POA: Diagnosis not present

## 2017-02-09 DIAGNOSIS — F5104 Psychophysiologic insomnia: Secondary | ICD-10-CM | POA: Diagnosis not present

## 2017-02-09 DIAGNOSIS — K219 Gastro-esophageal reflux disease without esophagitis: Secondary | ICD-10-CM | POA: Diagnosis not present

## 2017-02-09 DIAGNOSIS — I10 Essential (primary) hypertension: Secondary | ICD-10-CM | POA: Diagnosis not present

## 2017-02-10 DIAGNOSIS — M20012 Mallet finger of left finger(s): Secondary | ICD-10-CM | POA: Diagnosis not present

## 2017-03-17 ENCOUNTER — Encounter: Admission: RE | Payer: Self-pay | Source: Ambulatory Visit

## 2017-03-17 ENCOUNTER — Ambulatory Visit: Admission: RE | Admit: 2017-03-17 | Payer: Medicare Other | Source: Ambulatory Visit | Admitting: Ophthalmology

## 2017-03-17 SURGERY — PHACOEMULSIFICATION, CATARACT, WITH IOL INSERTION
Anesthesia: Choice | Laterality: Left

## 2017-03-24 DIAGNOSIS — M20012 Mallet finger of left finger(s): Secondary | ICD-10-CM | POA: Diagnosis not present

## 2017-04-21 DIAGNOSIS — M20012 Mallet finger of left finger(s): Secondary | ICD-10-CM | POA: Diagnosis not present

## 2017-05-05 DIAGNOSIS — M5416 Radiculopathy, lumbar region: Secondary | ICD-10-CM | POA: Diagnosis not present

## 2017-05-18 DIAGNOSIS — E7800 Pure hypercholesterolemia, unspecified: Secondary | ICD-10-CM | POA: Diagnosis not present

## 2017-05-18 DIAGNOSIS — Z131 Encounter for screening for diabetes mellitus: Secondary | ICD-10-CM | POA: Diagnosis not present

## 2017-05-18 DIAGNOSIS — I10 Essential (primary) hypertension: Secondary | ICD-10-CM | POA: Diagnosis not present

## 2017-05-18 DIAGNOSIS — Z8546 Personal history of malignant neoplasm of prostate: Secondary | ICD-10-CM | POA: Diagnosis not present

## 2017-05-18 DIAGNOSIS — Z125 Encounter for screening for malignant neoplasm of prostate: Secondary | ICD-10-CM | POA: Diagnosis not present

## 2017-05-25 DIAGNOSIS — K219 Gastro-esophageal reflux disease without esophagitis: Secondary | ICD-10-CM | POA: Diagnosis not present

## 2017-05-25 DIAGNOSIS — R7303 Prediabetes: Secondary | ICD-10-CM | POA: Diagnosis not present

## 2017-05-25 DIAGNOSIS — G44219 Episodic tension-type headache, not intractable: Secondary | ICD-10-CM | POA: Diagnosis not present

## 2017-05-25 DIAGNOSIS — E7800 Pure hypercholesterolemia, unspecified: Secondary | ICD-10-CM | POA: Diagnosis not present

## 2017-05-25 DIAGNOSIS — I10 Essential (primary) hypertension: Secondary | ICD-10-CM | POA: Diagnosis not present

## 2017-05-25 DIAGNOSIS — F5104 Psychophysiologic insomnia: Secondary | ICD-10-CM | POA: Diagnosis not present

## 2017-05-26 DIAGNOSIS — K219 Gastro-esophageal reflux disease without esophagitis: Secondary | ICD-10-CM | POA: Insufficient documentation

## 2017-05-26 DIAGNOSIS — R7303 Prediabetes: Secondary | ICD-10-CM | POA: Insufficient documentation

## 2017-05-26 DIAGNOSIS — E7800 Pure hypercholesterolemia, unspecified: Secondary | ICD-10-CM | POA: Insufficient documentation

## 2017-07-04 DIAGNOSIS — H2513 Age-related nuclear cataract, bilateral: Secondary | ICD-10-CM | POA: Diagnosis not present

## 2017-07-28 DIAGNOSIS — H2512 Age-related nuclear cataract, left eye: Secondary | ICD-10-CM | POA: Diagnosis not present

## 2017-08-11 ENCOUNTER — Ambulatory Visit: Payer: Medicare Other | Admitting: Anesthesiology

## 2017-08-11 ENCOUNTER — Encounter
Admission: RE | Disposition: A | Discharge status: Home / Self Care | Payer: Self-pay | Source: Ambulatory Visit | Attending: Ophthalmology

## 2017-08-11 ENCOUNTER — Ambulatory Visit
Admission: RE | Admit: 2017-08-11 | Discharge: 2017-08-11 | Disposition: A | Discharge status: Home / Self Care | Payer: Medicare Other | Source: Ambulatory Visit | Attending: Ophthalmology | Admitting: Ophthalmology

## 2017-08-11 DIAGNOSIS — E7800 Pure hypercholesterolemia, unspecified: Secondary | ICD-10-CM | POA: Insufficient documentation

## 2017-08-11 DIAGNOSIS — H2512 Age-related nuclear cataract, left eye: Secondary | ICD-10-CM | POA: Diagnosis not present

## 2017-08-11 DIAGNOSIS — Z87891 Personal history of nicotine dependence: Secondary | ICD-10-CM | POA: Diagnosis not present

## 2017-08-11 DIAGNOSIS — M1990 Unspecified osteoarthritis, unspecified site: Secondary | ICD-10-CM | POA: Insufficient documentation

## 2017-08-11 DIAGNOSIS — I10 Essential (primary) hypertension: Secondary | ICD-10-CM | POA: Insufficient documentation

## 2017-08-11 DIAGNOSIS — Z79899 Other long term (current) drug therapy: Secondary | ICD-10-CM | POA: Diagnosis not present

## 2017-08-11 DIAGNOSIS — Z882 Allergy status to sulfonamides status: Secondary | ICD-10-CM | POA: Diagnosis not present

## 2017-08-11 DIAGNOSIS — Z888 Allergy status to other drugs, medicaments and biological substances status: Secondary | ICD-10-CM | POA: Diagnosis not present

## 2017-08-11 DIAGNOSIS — K219 Gastro-esophageal reflux disease without esophagitis: Secondary | ICD-10-CM | POA: Insufficient documentation

## 2017-08-11 HISTORY — DX: Gastro-esophageal reflux disease without esophagitis: K21.9

## 2017-08-11 HISTORY — PX: CATARACT EXTRACTION W/PHACO: SHX586

## 2017-08-11 SURGERY — PHACOEMULSIFICATION, CATARACT, WITH IOL INSERTION
Anesthesia: Monitor Anesthesia Care | Laterality: Left

## 2017-08-11 MED ORDER — ARMC OPHTHALMIC DILATING DROPS
1.0000 "application " | OPHTHALMIC | Status: AC | PRN
  Administered 2017-08-11 (×3): 1 via OPHTHALMIC

## 2017-08-11 MED ORDER — FENTANYL CITRATE (PF) 100 MCG/2ML IJ SOLN
INTRAMUSCULAR | Status: DC | PRN
  Administered 2017-08-11: 25 ug via INTRAVENOUS
  Administered 2017-08-11: 50 ug via INTRAVENOUS
  Administered 2017-08-11: 25 ug via INTRAVENOUS

## 2017-08-11 MED ORDER — POVIDONE-IODINE 5 % OP SOLN
OPHTHALMIC | Status: AC
  Filled 2017-08-11: qty 30

## 2017-08-11 MED ORDER — LIDOCAINE HCL (PF) 4 % IJ SOLN
INTRAMUSCULAR | Status: AC
  Filled 2017-08-11: qty 5

## 2017-08-11 MED ORDER — SODIUM HYALURONATE 10 MG/ML IO SOLN
INTRAOCULAR | Status: DC | PRN
  Administered 2017-08-11: 0.55 mL via INTRAOCULAR

## 2017-08-11 MED ORDER — SODIUM HYALURONATE 23 MG/ML IO SOLN
INTRAOCULAR | Status: DC | PRN
  Administered 2017-08-11: 0.6 mL via INTRAOCULAR

## 2017-08-11 MED ORDER — SODIUM HYALURONATE 23 MG/ML IO SOLN
INTRAOCULAR | Status: AC
  Filled 2017-08-11: qty 0.6

## 2017-08-11 MED ORDER — MOXIFLOXACIN HCL 0.5 % OP SOLN
OPHTHALMIC | Status: AC
  Filled 2017-08-11: qty 3

## 2017-08-11 MED ORDER — MOXIFLOXACIN HCL 0.5 % OP SOLN
1.0000 [drp] | OPHTHALMIC | Status: AC | PRN
  Administered 2017-08-11: 0.2 mL via OPHTHALMIC

## 2017-08-11 MED ORDER — BSS IO SOLN
INTRAOCULAR | Status: DC | PRN
  Administered 2017-08-11: 1 via INTRAOCULAR

## 2017-08-11 MED ORDER — SODIUM CHLORIDE 0.9 % IV SOLN
INTRAVENOUS | Status: DC
  Administered 2017-08-11: 06:00:00 via INTRAVENOUS

## 2017-08-11 MED ORDER — FENTANYL CITRATE (PF) 100 MCG/2ML IJ SOLN
INTRAMUSCULAR | Status: AC
  Filled 2017-08-11: qty 2

## 2017-08-11 MED ORDER — PROPOFOL 10 MG/ML IV BOLUS
INTRAVENOUS | Status: AC
  Filled 2017-08-11: qty 20

## 2017-08-11 MED ORDER — EPINEPHRINE PF 1 MG/ML IJ SOLN
INTRAMUSCULAR | Status: AC
  Filled 2017-08-11: qty 1

## 2017-08-11 MED ORDER — LIDOCAINE HCL (PF) 4 % IJ SOLN
INTRAOCULAR | Status: DC | PRN
  Administered 2017-08-11: 1 mL via OPHTHALMIC

## 2017-08-11 MED ORDER — POVIDONE-IODINE 5 % OP SOLN
OPHTHALMIC | Status: DC | PRN
  Administered 2017-08-11: 1 via OPHTHALMIC

## 2017-08-11 MED ORDER — ARMC OPHTHALMIC DILATING DROPS
OPHTHALMIC | Status: AC
  Administered 2017-08-11: 1 via OPHTHALMIC
  Filled 2017-08-11: qty 0.4

## 2017-08-11 SURGICAL SUPPLY — 16 items
DISSECTOR HYDRO NUCLEUS 50X22 (MISCELLANEOUS) ×3 IMPLANT
GLOVE BIO SURGEON STRL SZ8 (GLOVE) ×3 IMPLANT
GLOVE BIOGEL M 6.5 STRL (GLOVE) ×3 IMPLANT
GLOVE SURG LX 7.5 STRW (GLOVE) ×2
GLOVE SURG LX STRL 7.5 STRW (GLOVE) ×1 IMPLANT
GOWN STRL REUS W/ TWL LRG LVL3 (GOWN DISPOSABLE) ×2 IMPLANT
GOWN STRL REUS W/TWL LRG LVL3 (GOWN DISPOSABLE) ×4
LABEL CATARACT MEDS ST (LABEL) ×3 IMPLANT
LENS IOL TECNIS ITEC 22.0 (Intraocular Lens) ×3 IMPLANT
PACK CATARACT (MISCELLANEOUS) ×3 IMPLANT
PACK CATARACT KING (MISCELLANEOUS) ×3 IMPLANT
PACK EYE AFTER SURG (MISCELLANEOUS) ×3 IMPLANT
SOL BSS BAG (MISCELLANEOUS) ×3
SOLUTION BSS BAG (MISCELLANEOUS) ×1 IMPLANT
WATER STERILE IRR 250ML POUR (IV SOLUTION) ×3 IMPLANT
WIPE NON LINTING 3.25X3.25 (MISCELLANEOUS) ×3 IMPLANT

## 2017-08-11 NOTE — Anesthesia Procedure Notes (Signed)
Procedure Name: MAC Performed by: Danna Sewell, CRNA Pre-anesthesia Checklist: Patient identified, Emergency Drugs available, Suction available, Patient being monitored and Timeout performed Oxygen Delivery Method: Nasal cannula       
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
## 2017-08-11 NOTE — Discharge Instructions (Signed)
FOLLOW DR. Melony Overly POSTOP EYE DROP SHEET AS REVIEWED.  Eye Surgery Discharge Instructions  Expect mild scratchy sensation or mild soreness. DO NOT RUB YOUR EYE!  The day of surgery:  Minimal physical activity, but bed rest is not required  No reading, computer work, or close hand work  No bending, lifting, or straining.  May watch TV  For 24 hours:  No driving, legal decisions, or alcoholic beverages  Safety precautions  Eat anything you prefer: It is better to start with liquids, then soup then solid foods.  _____ Eye patch should be worn until postoperative exam tomorrow.  ____ Solar shield eyeglasses should be worn for comfort in the sunlight/patch while sleeping  Resume all regular medications including aspirin or Coumadin if these were discontinued prior to surgery. You may shower, bathe, shave, or wash your hair. Tylenol may be taken for mild discomfort.  Call your doctor if you experience significant pain, nausea, or vomiting, fever > 101 or other signs of infection. [phone] or [phone] Specific instructions:  Follow-up Information    Eulogio Bear, MD Follow up.   Specialty:  Ophthalmology Why:  08/12/17 @ 9:00 am IN THE [facility] OFFICE Contact information: [place] [address] [phone]

## 2017-08-11 NOTE — Transfer of Care (Signed)
Immediate Anesthesia Transfer of Care Note  Patient: Seth Baxter  Procedure(s) Performed: CATARACT EXTRACTION PHACO AND INTRAOCULAR LENS PLACEMENT (IOC) (Left )  Patient Location: Short Stay  Anesthesia Type:MAC  Level of Consciousness: awake, alert  and oriented  Airway & Oxygen Therapy: Patient Spontanous Breathing  Post-op Assessment: Report given to RN and Post -op Vital signs reviewed and stable  Post vital signs: Reviewed and stable  Last Vitals:  Vitals:   08/11/17 0605  BP: (!) 209/83  Pulse: (!) 50  Resp: 16  Temp: (!) 34.8 C  SpO2: 100%    Last Pain:  Vitals:   08/11/17 0605  TempSrc: Tympanic         Complications: No apparent anesthesia complications

## 2017-08-11 NOTE — Anesthesia Post-op Follow-up Note (Signed)
Anesthesia QCDR form completed.        

## 2017-08-11 NOTE — Anesthesia Postprocedure Evaluation (Signed)
Anesthesia Post Note  Patient: Seth Baxter  Procedure(s) Performed: CATARACT EXTRACTION PHACO AND INTRAOCULAR LENS PLACEMENT (IOC) (Left )  Patient location during evaluation: PACU Anesthesia Type: MAC Level of consciousness: awake and alert and oriented Pain management: pain level controlled Vital Signs Assessment: post-procedure vital signs reviewed and stable Respiratory status: spontaneous breathing, nonlabored ventilation and respiratory function stable Cardiovascular status: blood pressure returned to baseline and stable Postop Assessment: no signs of nausea or vomiting Anesthetic complications: no     Last Vitals:  Vitals:   08/11/17 0800 08/11/17 0813  BP: (!) 181/75 131/72  Pulse: (!) 51 (!) 46  Resp: 16 16  Temp: 36.5 C   SpO2: 99% 96%    Last Pain:  Vitals:   08/11/17 0800  TempSrc: Oral                 Jerene Yeager

## 2017-08-11 NOTE — Op Note (Signed)
OPERATIVE NOTE  Seth Baxter [id number] 08/11/2017   PREOPERATIVE DIAGNOSIS:  Nuclear sclerotic cataract left eye.  H25.12   POSTOPERATIVE DIAGNOSIS:    Nuclear sclerotic cataract left eye.     PROCEDURE:  Phacoemusification with posterior chamber intraocular lens placement of the left eye   LENS:   Implant Name Type Inv. Item Serial No. Manufacturer Lot No. LRB No. Used  LENS IOL DIOP 22.0 - [id number] Intraocular Lens LENS IOL DIOP 22.0 [id number] AMO  Left 1       PCB00 +22.0   ULTRASOUND TIME: 0 minutes 23.9 seconds.  CDE 2.21   SURGEON:  Benay Pillow, MD, MPH   ANESTHESIA:  Topical with tetracaine drops augmented with 1% preservative-free intracameral lidocaine.  ESTIMATED BLOOD LOSS: <1 mL   COMPLICATIONS:  None.   DESCRIPTION OF PROCEDURE:  The patient was identified in the holding room and transported to the operating room and placed in the supine position under the operating microscope.  The left eye was identified as the operative eye and it was prepped and draped in the usual sterile ophthalmic fashion.   A 1.0 millimeter clear-corneal paracentesis was made at the 5:00 position. 0.5 ml of preservative-free 1% lidocaine with epinephrine was injected into the anterior chamber.  The anterior chamber was filled with Healon 5 viscoelastic.  A 2.4 millimeter keratome was used to make a near-clear corneal incision at the 2:00 position.  A curvilinear capsulorrhexis was made with a cystotome and capsulorrhexis forceps.  Balanced salt solution was used to hydrodissect and hydrodelineate the nucleus.   Phacoemulsification was then used in stop and chop fashion to remove the lens nucleus and epinucleus.  The remaining cortex was then removed using the irrigation and aspiration handpiece. Healon was then placed into the capsular bag to distend it for lens placement.  A lens was then injected into the capsular bag.  The remaining viscoelastic was aspirated.   Wounds were  hydrated with balanced salt solution.  The anterior chamber was inflated to a physiologic pressure with balanced salt solution.  Intracameral vigamox 0.1 mL undiltued was injected into the eye and a drop placed onto the ocular surface.  No wound leaks were noted.  The patient was taken to the recovery room in stable condition without complications of anesthesia or surgery  Benay Pillow 08/11/2017, 7:55 AM

## 2017-08-11 NOTE — H&P (Signed)
The History and Physical notes are on paper, have been signed, and are to be scanned.   I have examined the patient and there are no changes to the H&P.   Seth Baxter 08/11/2017 7:13 AM

## 2017-08-17 IMAGING — MR MR LUMBAR SPINE W/O CM
4 of 5 series · 24 of 48 positions shown · non-contrast
Comparison: MRI lumbar spine 03/24/2009.

CLINICAL DATA: Chronic low back pain radiating into the right leg
to the foot. Numbness in both legs after standing or running.
Symptoms are chronic and have worsened over the past 3 years. No
known injury. Initial encounter.

EXAM:
MRI LUMBAR SPINE WITHOUT CONTRAST
TECHNIQUE: Multiplanar, multisequence MR imaging of the lumbar spine was
performed. No intravenous contrast was administered.

[Series 2: T2 · sagittal · 4.0mm · 0.81mm/px · 7 of 15 slices shown (1 of 2)]
[im 1/15]
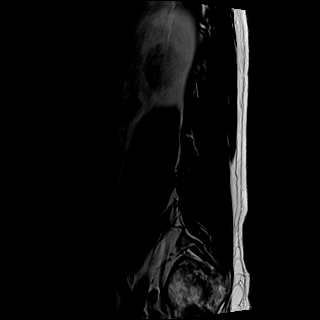
[im 3/15]
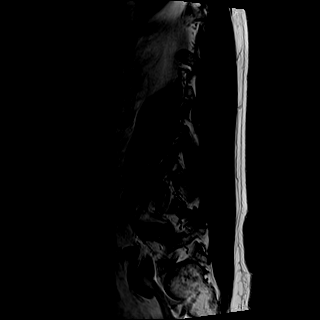
[im 5/15]
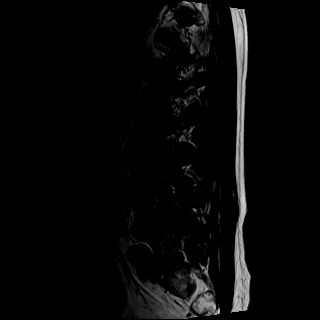
[im 8/15]
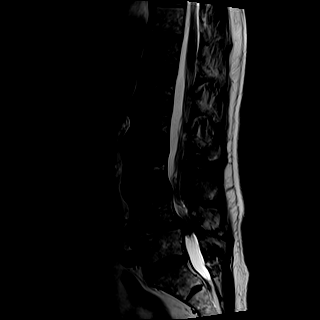
[im 10/15]
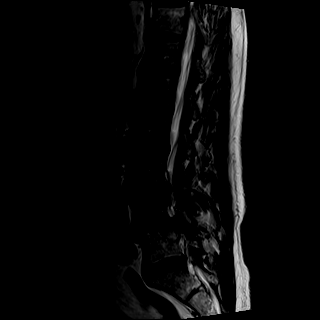
[im 12/15]
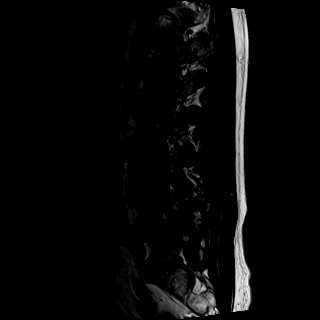
[im 15/15]
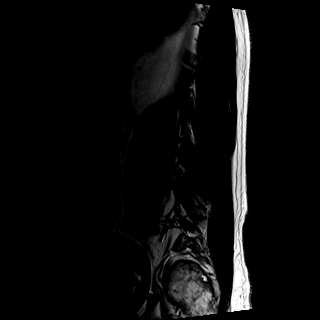

[Series 3: T1 · sagittal · 4.0mm · 0.41mm/px · 6 of 15 slices shown (1 of 2)]
[im 1/15]
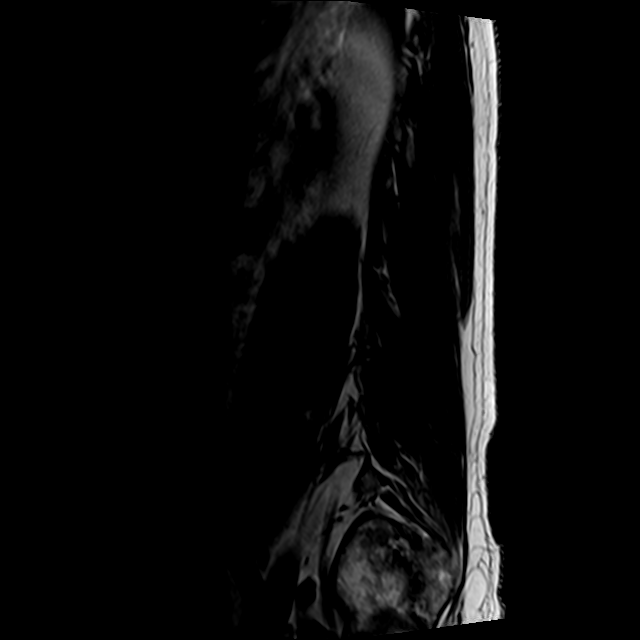
[im 3/15]
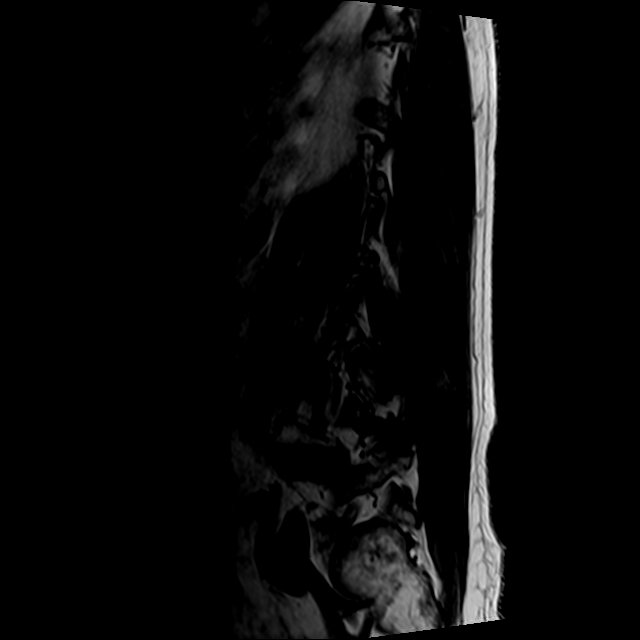
[im 5/15]
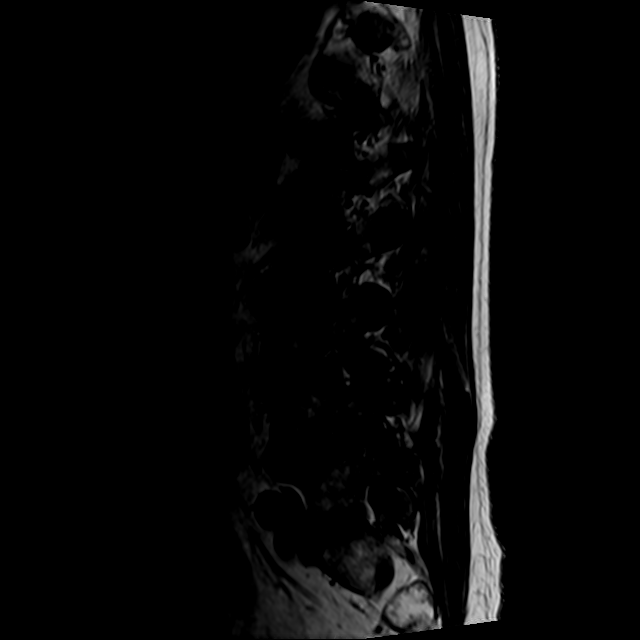
[im 8/15]
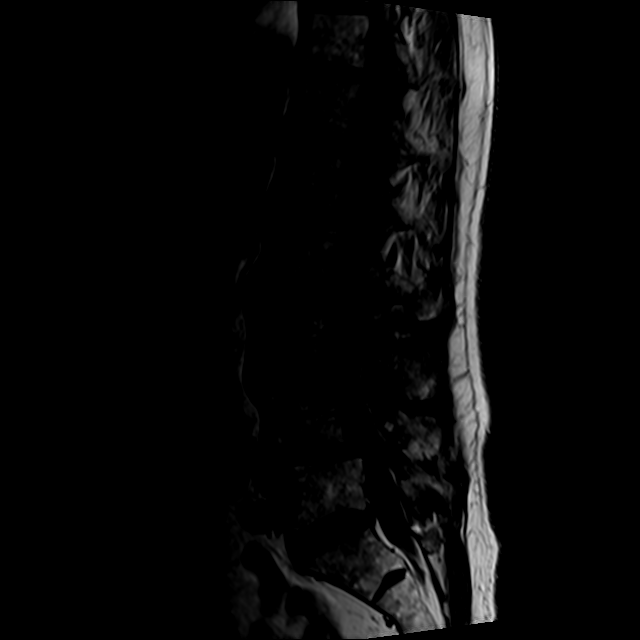
[im 10/15]
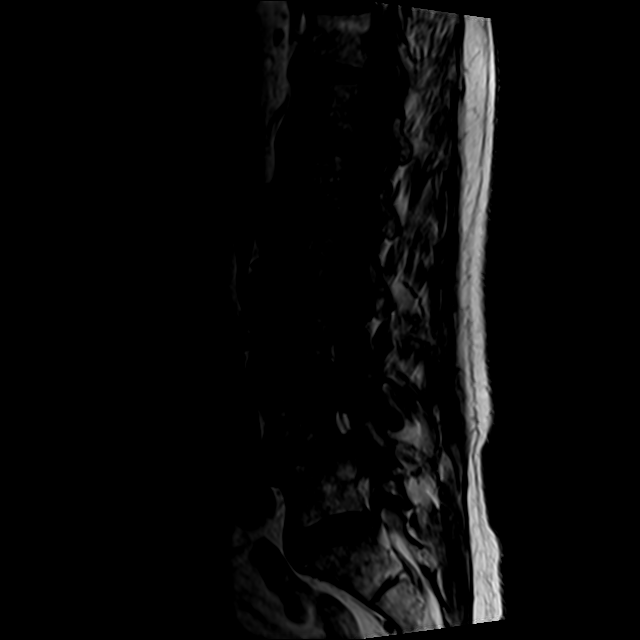
[im 12/15]
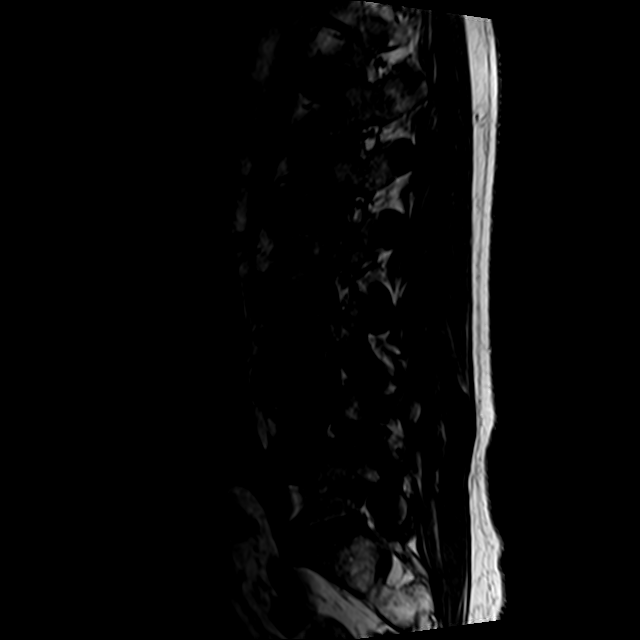

[Series 5: T2 · axial · 4.0mm · 0.78mm/px · z∈[-55,+128]mm · 8 of 33 slices shown (2 of 2)]
[im 1/33]
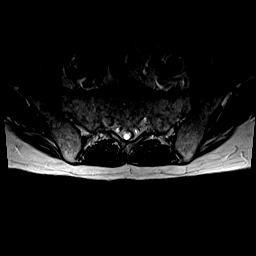
[im 5/33]
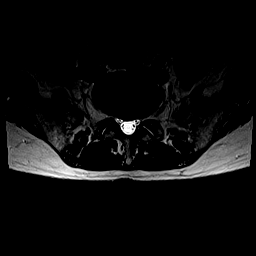
[im 10/33]
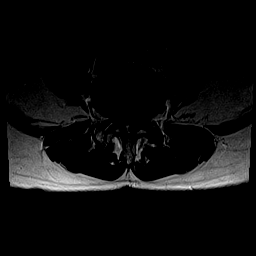
[im 15/33]
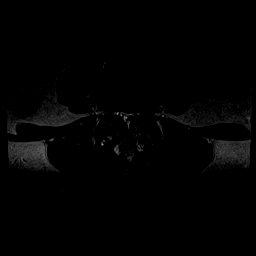
[im 18/33]
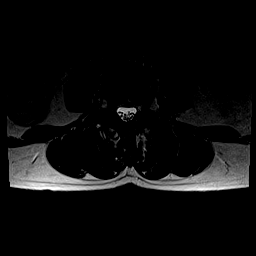
[im 23/33]
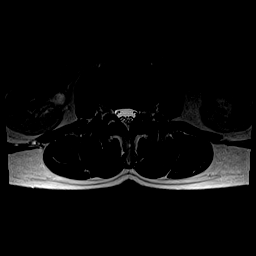
[im 28/33]
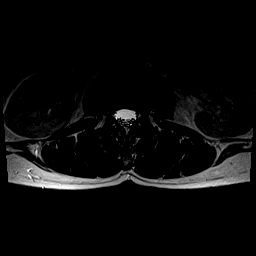
[im 33/33]
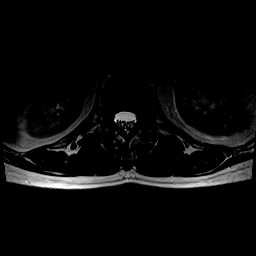

[Series 6: T1 · axial · 4.0mm · 0.31mm/px · z∈[-35,+103]mm · 3 of 33 slices shown (2 of 2)]
[im 5/33]
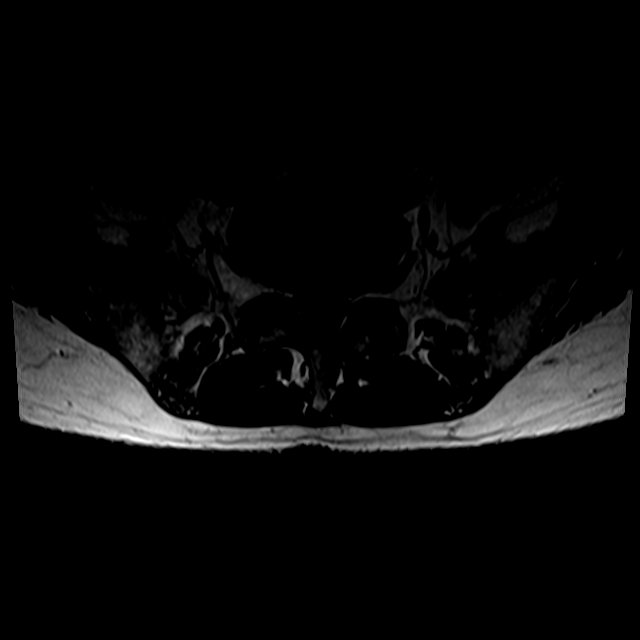
[im 18/33]
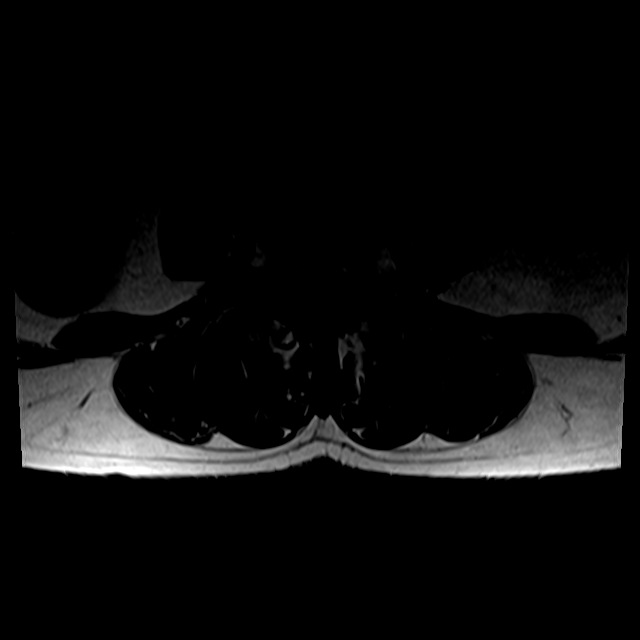
[im 28/33]
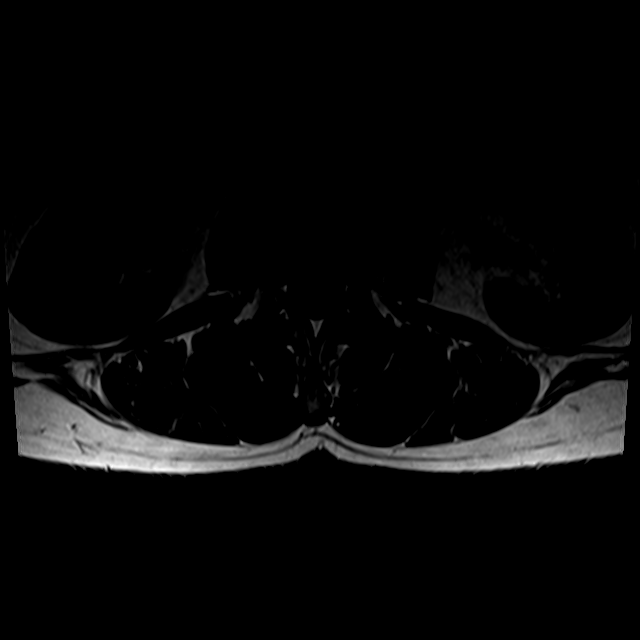

[24 of 48 positions shown; findings below may reference images not displayed]

FINDINGS: Vertebral body height and signal are unremarkable. There is new
facet mediated 0.3 cm anterolisthesis L3 on L4. 0.5 cm
anterolisthesis L4 on L5 is unchanged. The conus medullaris is
normal in signal and position. Imaged intra-abdominal contents are
unremarkable.

T11-12 and T12-L1 are imaged in the sagittal plane only and
negative.

L1-2:  Negative.

L2-3: Very mild disc bulge without central canal or foraminal
narrowing.

L3-4: Bilateral facet degenerative change is present. The disc is
uncovered with a shallow bulge and there is some ligamentum flavum
thickening. Mild central canal narrowing is seen. There is mild to
moderate bilateral foraminal narrowing. Spondylosis has progressed
since the prior examination.

L4-5: Advanced bilateral facet degenerative change is present.
Ligamentum flavum thickening is seen. The disc is uncovered and
diffusely bulging. Severe central canal stenosis at this level
appears worse than on the prior study. There is also severe left and
moderate to moderately severe right foraminal narrowing which has
worsened since the prior exam.

L5-S1: Very shallow disc bulge without central canal or foraminal
stenosis.
IMPRESSION: Interval worsening of spondylosis at L4-5 where there is severe
central canal and left foraminal narrowing. Moderate to moderately
severe right foraminal narrowing is also seen. Facet arthropathy
results in unchanged 0.5 cm anterolisthesis L4 on L5.

Progressive degenerative disease at L3-4 where there is mild central
canal and mild to moderate bilateral foraminal narrowing. Facet
degenerative change at this level results in 0.3 cm anterolisthesis
which is new since the prior exam.

## 2017-08-22 ENCOUNTER — Encounter: Payer: Self-pay | Admitting: *Deleted

## 2017-10-17 ENCOUNTER — Encounter: Payer: Self-pay | Admitting: *Deleted

## 2017-10-20 ENCOUNTER — Ambulatory Visit: Payer: Medicare Other | Admitting: Anesthesiology

## 2017-10-20 ENCOUNTER — Encounter: Payer: Self-pay | Admitting: Ophthalmology

## 2017-10-20 ENCOUNTER — Encounter
Admission: RE | Disposition: A | Discharge status: Home / Self Care | Payer: Self-pay | Source: Ambulatory Visit | Attending: Ophthalmology

## 2017-10-20 ENCOUNTER — Ambulatory Visit
Admission: RE | Admit: 2017-10-20 | Discharge: 2017-10-20 | Disposition: A | Discharge status: Home / Self Care | Payer: Medicare Other | Source: Ambulatory Visit | Attending: Ophthalmology | Admitting: Ophthalmology

## 2017-10-20 DIAGNOSIS — I10 Essential (primary) hypertension: Secondary | ICD-10-CM | POA: Diagnosis not present

## 2017-10-20 DIAGNOSIS — K219 Gastro-esophageal reflux disease without esophagitis: Secondary | ICD-10-CM | POA: Insufficient documentation

## 2017-10-20 DIAGNOSIS — Z8546 Personal history of malignant neoplasm of prostate: Secondary | ICD-10-CM | POA: Diagnosis not present

## 2017-10-20 DIAGNOSIS — Z87891 Personal history of nicotine dependence: Secondary | ICD-10-CM | POA: Diagnosis not present

## 2017-10-20 DIAGNOSIS — Z79899 Other long term (current) drug therapy: Secondary | ICD-10-CM | POA: Diagnosis not present

## 2017-10-20 DIAGNOSIS — H2511 Age-related nuclear cataract, right eye: Secondary | ICD-10-CM | POA: Diagnosis present

## 2017-10-20 DIAGNOSIS — Z791 Long term (current) use of non-steroidal anti-inflammatories (NSAID): Secondary | ICD-10-CM | POA: Insufficient documentation

## 2017-10-20 HISTORY — DX: Nausea with vomiting, unspecified: R11.2

## 2017-10-20 HISTORY — DX: Other complications of anesthesia, initial encounter: T88.59XA

## 2017-10-20 HISTORY — PX: CATARACT EXTRACTION W/PHACO: SHX586

## 2017-10-20 HISTORY — DX: Other specified postprocedural states: Z98.890

## 2017-10-20 HISTORY — DX: Adverse effect of unspecified anesthetic, initial encounter: T41.45XA

## 2017-10-20 SURGERY — PHACOEMULSIFICATION, CATARACT, WITH IOL INSERTION
Anesthesia: Monitor Anesthesia Care | Site: Eye | Laterality: Right | Wound class: "Clean "

## 2017-10-20 MED ORDER — SODIUM HYALURONATE 23 MG/ML IO SOLN
INTRAOCULAR | Status: DC | PRN
  Administered 2017-10-20: 0.6 mL via INTRAOCULAR

## 2017-10-20 MED ORDER — ONDANSETRON HCL 4 MG/2ML IJ SOLN
INTRAMUSCULAR | Status: DC | PRN
  Administered 2017-10-20: 4 mg via INTRAVENOUS

## 2017-10-20 MED ORDER — SODIUM HYALURONATE 10 MG/ML IO SOLN
INTRAOCULAR | Status: DC | PRN
  Administered 2017-10-20: 0.55 mL via INTRAOCULAR

## 2017-10-20 MED ORDER — MOXIFLOXACIN HCL 0.5 % OP SOLN: 1.0000 [drp] | OPHTHALMIC | Status: DC | PRN

## 2017-10-20 MED ORDER — DEXAMETHASONE SODIUM PHOSPHATE 10 MG/ML IJ SOLN
INTRAMUSCULAR | Status: DC | PRN
  Administered 2017-10-20: 6 mg via INTRAVENOUS

## 2017-10-20 MED ORDER — MIDAZOLAM HCL 2 MG/2ML IJ SOLN
INTRAMUSCULAR | Status: DC | PRN
  Administered 2017-10-20 (×2): 1 mg via INTRAVENOUS

## 2017-10-20 MED ORDER — POVIDONE-IODINE 5 % OP SOLN
OPHTHALMIC | Status: DC | PRN
  Administered 2017-10-20: 1 via OPHTHALMIC

## 2017-10-20 MED ORDER — MOXIFLOXACIN HCL 0.5 % OP SOLN
OPHTHALMIC | Status: AC
  Filled 2017-10-20: qty 3

## 2017-10-20 MED ORDER — BSS IO SOLN
INTRAOCULAR | Status: DC | PRN
  Administered 2017-10-20: 1

## 2017-10-20 MED ORDER — POVIDONE-IODINE 5 % OP SOLN
OPHTHALMIC | Status: AC
  Filled 2017-10-20: qty 30

## 2017-10-20 MED ORDER — ARMC OPHTHALMIC DILATING DROPS
1.0000 "application " | OPHTHALMIC | Status: AC
  Administered 2017-10-20 (×3): 1 via OPHTHALMIC

## 2017-10-20 MED ORDER — ARMC OPHTHALMIC DILATING DROPS
OPHTHALMIC | Status: AC
  Administered 2017-10-20: 1 via OPHTHALMIC
  Filled 2017-10-20: qty 0.4

## 2017-10-20 MED ORDER — SODIUM CHLORIDE 0.9 % IV SOLN
INTRAVENOUS | Status: DC
  Administered 2017-10-20: 06:00:00 via INTRAVENOUS

## 2017-10-20 MED ORDER — MOXIFLOXACIN HCL 0.5 % OP SOLN
OPHTHALMIC | Status: DC | PRN
  Administered 2017-10-20: 0.2 mL via OPHTHALMIC

## 2017-10-20 MED ORDER — LIDOCAINE HCL (PF) 4 % IJ SOLN
INTRAMUSCULAR | Status: AC
  Filled 2017-10-20: qty 5

## 2017-10-20 MED ORDER — LIDOCAINE HCL (PF) 4 % IJ SOLN
INTRAOCULAR | Status: DC | PRN
  Administered 2017-10-20: 2 mL via OPHTHALMIC

## 2017-10-20 MED ORDER — SODIUM HYALURONATE 23 MG/ML IO SOLN
INTRAOCULAR | Status: AC
  Filled 2017-10-20: qty 0.6

## 2017-10-20 MED ORDER — EPINEPHRINE PF 1 MG/ML IJ SOLN
INTRAMUSCULAR | Status: AC
  Filled 2017-10-20: qty 1

## 2017-10-20 MED ORDER — MIDAZOLAM HCL 2 MG/2ML IJ SOLN
INTRAMUSCULAR | Status: AC
Start: 2017-10-20 — End: 2017-10-20
  Filled 2017-10-20: qty 2

## 2017-10-20 MED ORDER — FENTANYL CITRATE (PF) 100 MCG/2ML IJ SOLN
INTRAMUSCULAR | Status: AC
  Filled 2017-10-20: qty 2

## 2017-10-20 SURGICAL SUPPLY — 16 items
DISSECTOR HYDRO NUCLEUS 50X22 (MISCELLANEOUS) ×2 IMPLANT
GLOVE BIO SURGEON STRL SZ8 (GLOVE) ×2 IMPLANT
GLOVE BIOGEL M 6.5 STRL (GLOVE) ×2 IMPLANT
GLOVE SURG LX 7.5 STRW (GLOVE) ×1
GLOVE SURG LX STRL 7.5 STRW (GLOVE) ×1 IMPLANT
GOWN STRL REUS W/ TWL LRG LVL3 (GOWN DISPOSABLE) ×2 IMPLANT
GOWN STRL REUS W/TWL LRG LVL3 (GOWN DISPOSABLE) ×2
LABEL CATARACT MEDS ST (LABEL) ×2 IMPLANT
LENS IOL TECNIS ITEC 21.0 (Intraocular Lens) ×1 IMPLANT
PACK CATARACT (MISCELLANEOUS) ×2 IMPLANT
PACK CATARACT KING (MISCELLANEOUS) ×2 IMPLANT
PACK EYE AFTER SURG (MISCELLANEOUS) ×2 IMPLANT
SOL BSS BAG (MISCELLANEOUS) ×2
SOLUTION BSS BAG (MISCELLANEOUS) ×1 IMPLANT
WATER STERILE IRR 250ML POUR (IV SOLUTION) ×2 IMPLANT
WIPE NON LINTING 3.25X3.25 (MISCELLANEOUS) ×2 IMPLANT

## 2017-10-20 NOTE — Transfer of Care (Signed)
Immediate Anesthesia Transfer of Care Note  Patient: Seth Baxter  Procedure(s) Performed: CATARACT EXTRACTION PHACO AND INTRAOCULAR LENS PLACEMENT (IOC) (Right Eye)  Patient Location: PACU  Anesthesia Type:MAC  Level of Consciousness: awake, alert  and oriented  Airway & Oxygen Therapy: Patient Spontanous Breathing  Post-op Assessment: Report given to RN and Post -op Vital signs reviewed and stable  Post vital signs: Reviewed and stable  Last Vitals:  Vitals Value Taken Time  BP    Temp    Pulse    Resp    SpO2      Last Pain:  Vitals:   10/20/17 0615  TempSrc: Tympanic         Complications: No apparent anesthesia complications

## 2017-10-20 NOTE — Discharge Instructions (Signed)
Eye Surgery Discharge Instructions  Expect mild scratchy sensation or mild soreness. DO NOT RUB YOUR EYE!  The day of surgery:  Minimal physical activity, but bed rest is not required  No reading, computer work, or close hand work  No bending, lifting, or straining.  May watch TV  For 24 hours:  No driving, legal decisions, or alcoholic beverages  Safety precautions  Eat anything you prefer: It is better to start with liquids, then soup then solid foods.  _____ Eye patch should be worn until postoperative exam tomorrow.  ____ Solar shield eyeglasses should be worn for comfort in the sunlight/patch while sleeping  Resume all regular medications including aspirin or Coumadin if these were discontinued prior to surgery. You may shower, bathe, shave, or wash your hair. Tylenol may be taken for mild discomfort.  Call your doctor if you experience significant pain, nausea, or vomiting, fever > 101 or other signs of infection. [phone] or [phone] Specific instructions:  Follow-up Information    Eulogio Bear, MD Follow up on 10/21/2017.   Specialty:  Ophthalmology Why:  follow up tomorrow at [place] information: [place] [address] [phone]         AMBULATORY SURGERY  DISCHARGE INSTRUCTIONS   1) The drugs that you were given will stay in your system until tomorrow so for the next 24 hours you should not:  A) Drive an automobile B) Make any legal decisions C) Drink any alcoholic beverage   2) You may resume regular meals tomorrow.  Today it is better to start with liquids and gradually work up to solid foods.  You may eat anything you prefer, but it is better to start with liquids, then soup and crackers, and gradually work up to solid foods.   3) Please notify your doctor immediately if you have any unusual bleeding, trouble breathing, redness and pain at the surgery site, drainage, fever, or pain not relieved by  medication.    4) Additional Instructions:        Please contact your physician with any problems or Same Day Surgery at [phone], Monday through Friday 6 am to 4 pm, or [place] at [facility] number at [phone].

## 2017-10-20 NOTE — Op Note (Signed)
OPERATIVE NOTE  Seth Baxter [id number] 10/20/2017   PREOPERATIVE DIAGNOSIS:  Nuclear sclerotic cataract right eye.  H25.11   POSTOPERATIVE DIAGNOSIS:    Nuclear sclerotic cataract right eye.     PROCEDURE:  Phacoemusification with posterior chamber intraocular lens placement of the right eye   LENS:   Implant Name Type Inv. Item Serial No. Manufacturer Lot No. LRB No. Used  LENS IOL DIOP 21.0 - [id number] Intraocular Lens LENS IOL DIOP 21.0 [id number] AMO  Right 1       PCB00 +21.0   ULTRASOUND TIME: 0 minutes 18 seconds.  CDE 2.09   SURGEON:  Benay Pillow, MD, MPH  ANESTHESIOLOGIST: Anesthesiologist: Gunnar Fusi, MD CRNA: Philbert Riser, CRNA   ANESTHESIA:  Topical with tetracaine drops augmented with 1% preservative-free intracameral lidocaine.  ESTIMATED BLOOD LOSS: less than 1 mL.   COMPLICATIONS:  None.   DESCRIPTION OF PROCEDURE:  The patient was identified in the holding room and transported to the operating room and placed in the supine position under the operating microscope.  The right eye was identified as the operative eye and it was prepped and draped in the usual sterile ophthalmic fashion.   A 1.0 millimeter clear-corneal paracentesis was made at the 10:30 position. 0.5 ml of preservative-free 1% lidocaine with epinephrine was injected into the anterior chamber.  The anterior chamber was filled with Healon 5 viscoelastic.  A 2.4 millimeter keratome was used to make a near-clear corneal incision at the 8:00 position.  A curvilinear capsulorrhexis was made with a cystotome and capsulorrhexis forceps.  Balanced salt solution was used to hydrodissect and hydrodelineate the nucleus.   Phacoemulsification was then used in stop and chop fashion to remove the lens nucleus and epinucleus.  The remaining cortex was then removed using the irrigation and aspiration handpiece. Healon was then placed into the capsular bag to distend it for lens placement.  A lens  was then injected into the capsular bag.  The remaining viscoelastic was aspirated.   Wounds were hydrated with balanced salt solution.  The anterior chamber was inflated to a physiologic pressure with balanced salt solution.   Intracameral vigamox 0.1 mL undiluted was injected into the eye and a drop placed onto the ocular surface.  No wound leaks were noted.  The patient was taken to the recovery room in stable condition without complications of anesthesia or surgery  Benay Pillow 10/20/2017, 7:55 AM

## 2017-10-20 NOTE — Anesthesia Postprocedure Evaluation (Signed)
Anesthesia Post Note  Patient: Seth Baxter  Procedure(s) Performed: CATARACT EXTRACTION PHACO AND INTRAOCULAR LENS PLACEMENT ([place]) (Right Eye)  Patient location during evaluation: Other Anesthesia Type: MAC Level of consciousness: awake and alert Pain management: pain level controlled Vital Signs Assessment: post-procedure vital signs reviewed and stable Respiratory status: spontaneous breathing and respiratory function stable Cardiovascular status: stable Anesthetic complications: no     Last Vitals:  Vitals Value Taken Time  BP    Temp    Pulse    Resp    SpO2      Last Pain:  Vitals:   10/20/17 0800  TempSrc: Temporal                 [name]

## 2017-10-20 NOTE — Anesthesia Post-op Follow-up Note (Signed)
Anesthesia QCDR form completed.        

## 2017-10-20 NOTE — Anesthesia Preprocedure Evaluation (Signed)
Anesthesia Evaluation  Patient identified by MRN, date of birth, ID band Patient awake    Reviewed: Allergy & Precautions, NPO status   History of Anesthesia Complications (+) PONV and history of anesthetic complications  Airway Mallampati: II       Dental   Pulmonary neg sleep apnea, neg COPD, former smoker,           Cardiovascular hypertension, Pt. on medications (-) Past MI and (-) CHF (-) dysrhythmias      Neuro/Psych neg Seizures    GI/Hepatic GERD  Medicated and Controlled,  Endo/Other  neg diabetes  Renal/GU negative Renal ROS     Musculoskeletal   Abdominal   Peds  Hematology   Anesthesia Other Findings   Reproductive/Obstetrics                            Anesthesia Physical Anesthesia Plan  ASA: II  Anesthesia Plan: MAC   Post-op Pain Management:    Induction:   PONV Risk Score and Plan: Ondansetron and Dexamethasone  Airway Management Planned: Nasal Cannula  Additional Equipment:   Intra-op Plan:   Post-operative Plan:   Informed Consent: I have reviewed the patients History and Physical, chart, labs and discussed the procedure including the risks, benefits and alternatives for the proposed anesthesia with the patient or authorized representative who has indicated his/her understanding and acceptance.     Plan Discussed with:   Anesthesia Plan Comments:         Anesthesia Quick Evaluation

## 2017-10-20 NOTE — H&P (Signed)
The History and Physical notes are on paper, have been signed, and are to be scanned.   I have examined the patient and there are no changes to the H&P.   Benay Pillow 10/20/2017 7:21 AM

## 2017-10-22 ENCOUNTER — Other Ambulatory Visit: Payer: Self-pay | Admitting: Family

## 2017-11-29 ENCOUNTER — Other Ambulatory Visit: Payer: Self-pay | Admitting: Family

## 2017-11-29 DIAGNOSIS — E785 Hyperlipidemia, unspecified: Secondary | ICD-10-CM

## 2017-12-04 DIAGNOSIS — R001 Bradycardia, unspecified: Secondary | ICD-10-CM | POA: Insufficient documentation

## 2017-12-05 DIAGNOSIS — M79671 Pain in right foot: Secondary | ICD-10-CM | POA: Insufficient documentation

## 2017-12-05 DIAGNOSIS — M189 Osteoarthritis of first carpometacarpal joint, unspecified: Secondary | ICD-10-CM | POA: Insufficient documentation

## 2017-12-05 DIAGNOSIS — M79672 Pain in left foot: Secondary | ICD-10-CM

## 2018-02-15 ENCOUNTER — Encounter: Payer: Self-pay | Admitting: Podiatry

## 2018-02-15 ENCOUNTER — Ambulatory Visit (INDEPENDENT_AMBULATORY_CARE_PROVIDER_SITE_OTHER): Payer: Medicare Other | Admitting: Podiatry

## 2018-02-15 DIAGNOSIS — M12819 Other specific arthropathies, not elsewhere classified, unspecified shoulder: Secondary | ICD-10-CM | POA: Insufficient documentation

## 2018-02-15 DIAGNOSIS — M75100 Unspecified rotator cuff tear or rupture of unspecified shoulder, not specified as traumatic: Secondary | ICD-10-CM | POA: Insufficient documentation

## 2018-02-15 DIAGNOSIS — L600 Ingrowing nail: Secondary | ICD-10-CM | POA: Diagnosis not present

## 2018-02-15 DIAGNOSIS — M4310 Spondylolisthesis, site unspecified: Secondary | ICD-10-CM | POA: Insufficient documentation

## 2018-02-15 DIAGNOSIS — M1710 Unilateral primary osteoarthritis, unspecified knee: Secondary | ICD-10-CM | POA: Insufficient documentation

## 2018-02-15 MED ORDER — NEOMYCIN-POLYMYXIN-HC 1 % OT SOLN: OTIC | 1 refills | Status: DC

## 2018-02-15 NOTE — Progress Notes (Signed)
He presents today chief complaint of a painful ingrown toenail along the lateral border of the hallux right.  Objective: Vital signs are stable he is alert and oriented x3 sharp incurvated nail margin along the fibular border of the hallux right.  Assessment: Ingrown toenail paronychia abscess fibular border hallux right.  Plan: Chemical matricectomy was performed today after local anesthetic was administered.  He tolerated procedure well without complications.  He was given both oral and written home-going instructions for care and soaking of his toe as well as prescription for Cortisporin Otic to be applied twice daily after soaking.  Follow-up with him in 2 weeks.

## 2018-02-15 NOTE — Patient Instructions (Signed)

## 2018-03-06 ENCOUNTER — Ambulatory Visit: Payer: Medicare Other | Admitting: Podiatry

## 2018-08-04 DIAGNOSIS — I10 Essential (primary) hypertension: Secondary | ICD-10-CM | POA: Diagnosis not present

## 2018-08-04 DIAGNOSIS — R7303 Prediabetes: Secondary | ICD-10-CM | POA: Diagnosis not present

## 2018-08-11 DIAGNOSIS — E7800 Pure hypercholesterolemia, unspecified: Secondary | ICD-10-CM | POA: Diagnosis not present

## 2018-08-11 DIAGNOSIS — I10 Essential (primary) hypertension: Secondary | ICD-10-CM | POA: Diagnosis not present

## 2018-08-11 DIAGNOSIS — R7303 Prediabetes: Secondary | ICD-10-CM | POA: Diagnosis not present

## 2018-08-17 DIAGNOSIS — M48061 Spinal stenosis, lumbar region without neurogenic claudication: Secondary | ICD-10-CM | POA: Diagnosis not present

## 2018-08-17 DIAGNOSIS — M5416 Radiculopathy, lumbar region: Secondary | ICD-10-CM | POA: Diagnosis not present

## 2018-08-24 DIAGNOSIS — M5416 Radiculopathy, lumbar region: Secondary | ICD-10-CM | POA: Diagnosis not present

## 2018-12-15 DIAGNOSIS — G43109 Migraine with aura, not intractable, without status migrainosus: Secondary | ICD-10-CM | POA: Diagnosis not present

## 2018-12-15 DIAGNOSIS — R7303 Prediabetes: Secondary | ICD-10-CM | POA: Diagnosis not present

## 2018-12-15 DIAGNOSIS — M159 Polyosteoarthritis, unspecified: Secondary | ICD-10-CM | POA: Insufficient documentation

## 2018-12-15 DIAGNOSIS — E785 Hyperlipidemia, unspecified: Secondary | ICD-10-CM | POA: Insufficient documentation

## 2018-12-15 DIAGNOSIS — M130 Polyarthritis, unspecified: Secondary | ICD-10-CM | POA: Insufficient documentation

## 2018-12-15 DIAGNOSIS — K219 Gastro-esophageal reflux disease without esophagitis: Secondary | ICD-10-CM | POA: Diagnosis not present

## 2018-12-15 DIAGNOSIS — E782 Mixed hyperlipidemia: Secondary | ICD-10-CM | POA: Diagnosis not present

## 2018-12-15 DIAGNOSIS — I10 Essential (primary) hypertension: Secondary | ICD-10-CM | POA: Diagnosis not present

## 2018-12-28 DIAGNOSIS — M48061 Spinal stenosis, lumbar region without neurogenic claudication: Secondary | ICD-10-CM | POA: Diagnosis not present

## 2018-12-28 DIAGNOSIS — M5416 Radiculopathy, lumbar region: Secondary | ICD-10-CM | POA: Diagnosis not present

## 2019-01-01 DIAGNOSIS — M1711 Unilateral primary osteoarthritis, right knee: Secondary | ICD-10-CM | POA: Diagnosis not present

## 2019-01-09 DIAGNOSIS — R7303 Prediabetes: Secondary | ICD-10-CM | POA: Diagnosis not present

## 2019-01-09 DIAGNOSIS — K219 Gastro-esophageal reflux disease without esophagitis: Secondary | ICD-10-CM | POA: Diagnosis not present

## 2019-01-09 DIAGNOSIS — E782 Mixed hyperlipidemia: Secondary | ICD-10-CM | POA: Diagnosis not present

## 2019-01-09 DIAGNOSIS — I10 Essential (primary) hypertension: Secondary | ICD-10-CM | POA: Diagnosis not present

## 2019-01-19 DIAGNOSIS — M25572 Pain in left ankle and joints of left foot: Secondary | ICD-10-CM | POA: Diagnosis not present

## 2019-01-19 DIAGNOSIS — M1711 Unilateral primary osteoarthritis, right knee: Secondary | ICD-10-CM | POA: Diagnosis not present

## 2019-03-20 DIAGNOSIS — Z13820 Encounter for screening for osteoporosis: Secondary | ICD-10-CM | POA: Diagnosis not present

## 2019-03-20 DIAGNOSIS — M19042 Primary osteoarthritis, left hand: Secondary | ICD-10-CM | POA: Diagnosis not present

## 2019-03-20 DIAGNOSIS — M79644 Pain in right finger(s): Secondary | ICD-10-CM | POA: Diagnosis not present

## 2019-03-20 DIAGNOSIS — M7989 Other specified soft tissue disorders: Secondary | ICD-10-CM | POA: Diagnosis not present

## 2019-03-20 DIAGNOSIS — G8929 Other chronic pain: Secondary | ICD-10-CM | POA: Diagnosis not present

## 2019-03-20 DIAGNOSIS — M1711 Unilateral primary osteoarthritis, right knee: Secondary | ICD-10-CM | POA: Diagnosis not present

## 2019-03-20 DIAGNOSIS — M2550 Pain in unspecified joint: Secondary | ICD-10-CM | POA: Insufficient documentation

## 2019-03-20 DIAGNOSIS — M25572 Pain in left ankle and joints of left foot: Secondary | ICD-10-CM | POA: Diagnosis not present

## 2019-03-23 DIAGNOSIS — Q828 Other specified congenital malformations of skin: Secondary | ICD-10-CM | POA: Diagnosis not present

## 2019-03-23 DIAGNOSIS — M2042 Other hammer toe(s) (acquired), left foot: Secondary | ICD-10-CM | POA: Diagnosis not present

## 2019-03-23 DIAGNOSIS — Q667 Congenital pes cavus, unspecified foot: Secondary | ICD-10-CM | POA: Diagnosis not present

## 2019-03-23 DIAGNOSIS — M25572 Pain in left ankle and joints of left foot: Secondary | ICD-10-CM | POA: Diagnosis not present

## 2019-03-23 DIAGNOSIS — D2372 Other benign neoplasm of skin of left lower limb, including hip: Secondary | ICD-10-CM | POA: Diagnosis not present

## 2019-03-23 DIAGNOSIS — M19072 Primary osteoarthritis, left ankle and foot: Secondary | ICD-10-CM | POA: Diagnosis not present

## 2019-03-23 DIAGNOSIS — M2041 Other hammer toe(s) (acquired), right foot: Secondary | ICD-10-CM | POA: Diagnosis not present

## 2019-03-23 DIAGNOSIS — M79672 Pain in left foot: Secondary | ICD-10-CM | POA: Diagnosis not present

## 2019-03-27 DIAGNOSIS — R7303 Prediabetes: Secondary | ICD-10-CM | POA: Diagnosis not present

## 2019-03-27 DIAGNOSIS — M549 Dorsalgia, unspecified: Secondary | ICD-10-CM | POA: Diagnosis not present

## 2019-03-27 DIAGNOSIS — I10 Essential (primary) hypertension: Secondary | ICD-10-CM | POA: Diagnosis not present

## 2019-03-27 DIAGNOSIS — E782 Mixed hyperlipidemia: Secondary | ICD-10-CM | POA: Diagnosis not present

## 2019-03-27 DIAGNOSIS — M130 Polyarthritis, unspecified: Secondary | ICD-10-CM | POA: Diagnosis not present

## 2019-03-27 DIAGNOSIS — G4762 Sleep related leg cramps: Secondary | ICD-10-CM | POA: Diagnosis not present

## 2019-04-11 DIAGNOSIS — M2550 Pain in unspecified joint: Secondary | ICD-10-CM | POA: Diagnosis not present

## 2019-04-11 DIAGNOSIS — M8949 Other hypertrophic osteoarthropathy, multiple sites: Secondary | ICD-10-CM | POA: Insufficient documentation

## 2019-04-11 DIAGNOSIS — M159 Polyosteoarthritis, unspecified: Secondary | ICD-10-CM | POA: Insufficient documentation

## 2019-04-30 DIAGNOSIS — M2041 Other hammer toe(s) (acquired), right foot: Secondary | ICD-10-CM | POA: Diagnosis not present

## 2019-04-30 DIAGNOSIS — Q667 Congenital pes cavus, unspecified foot: Secondary | ICD-10-CM | POA: Diagnosis not present

## 2019-04-30 DIAGNOSIS — M2042 Other hammer toe(s) (acquired), left foot: Secondary | ICD-10-CM | POA: Diagnosis not present

## 2019-04-30 DIAGNOSIS — G8929 Other chronic pain: Secondary | ICD-10-CM | POA: Diagnosis not present

## 2019-04-30 DIAGNOSIS — M19072 Primary osteoarthritis, left ankle and foot: Secondary | ICD-10-CM | POA: Diagnosis not present

## 2019-04-30 DIAGNOSIS — M25572 Pain in left ankle and joints of left foot: Secondary | ICD-10-CM | POA: Diagnosis not present

## 2019-05-02 DIAGNOSIS — S76312A Strain of muscle, fascia and tendon of the posterior muscle group at thigh level, left thigh, initial encounter: Secondary | ICD-10-CM | POA: Diagnosis not present

## 2019-05-10 DIAGNOSIS — S76312A Strain of muscle, fascia and tendon of the posterior muscle group at thigh level, left thigh, initial encounter: Secondary | ICD-10-CM | POA: Diagnosis not present

## 2019-05-17 DIAGNOSIS — S76312D Strain of muscle, fascia and tendon of the posterior muscle group at thigh level, left thigh, subsequent encounter: Secondary | ICD-10-CM | POA: Diagnosis not present

## 2019-05-24 DIAGNOSIS — M48061 Spinal stenosis, lumbar region without neurogenic claudication: Secondary | ICD-10-CM | POA: Diagnosis not present

## 2019-05-24 DIAGNOSIS — M5416 Radiculopathy, lumbar region: Secondary | ICD-10-CM | POA: Diagnosis not present

## 2019-06-08 DIAGNOSIS — M19072 Primary osteoarthritis, left ankle and foot: Secondary | ICD-10-CM | POA: Diagnosis not present

## 2019-06-13 DIAGNOSIS — M25472 Effusion, left ankle: Secondary | ICD-10-CM | POA: Diagnosis not present

## 2019-06-13 DIAGNOSIS — M8949 Other hypertrophic osteoarthropathy, multiple sites: Secondary | ICD-10-CM | POA: Diagnosis not present

## 2019-06-26 DIAGNOSIS — M8949 Other hypertrophic osteoarthropathy, multiple sites: Secondary | ICD-10-CM | POA: Diagnosis not present

## 2019-06-26 DIAGNOSIS — M7752 Other enthesopathy of left foot: Secondary | ICD-10-CM | POA: Diagnosis not present

## 2019-06-26 DIAGNOSIS — M1711 Unilateral primary osteoarthritis, right knee: Secondary | ICD-10-CM | POA: Diagnosis not present

## 2019-07-10 DIAGNOSIS — Z1212 Encounter for screening for malignant neoplasm of rectum: Secondary | ICD-10-CM | POA: Diagnosis not present

## 2019-07-10 DIAGNOSIS — Z1211 Encounter for screening for malignant neoplasm of colon: Secondary | ICD-10-CM | POA: Diagnosis not present

## 2019-07-11 DIAGNOSIS — Q667 Congenital pes cavus, unspecified foot: Secondary | ICD-10-CM | POA: Diagnosis not present

## 2019-07-11 DIAGNOSIS — M249 Joint derangement, unspecified: Secondary | ICD-10-CM | POA: Diagnosis not present

## 2019-07-11 DIAGNOSIS — M2041 Other hammer toe(s) (acquired), right foot: Secondary | ICD-10-CM | POA: Diagnosis not present

## 2019-07-11 DIAGNOSIS — M19072 Primary osteoarthritis, left ankle and foot: Secondary | ICD-10-CM | POA: Diagnosis not present

## 2019-07-11 DIAGNOSIS — G8929 Other chronic pain: Secondary | ICD-10-CM | POA: Diagnosis not present

## 2019-07-11 DIAGNOSIS — M25572 Pain in left ankle and joints of left foot: Secondary | ICD-10-CM | POA: Diagnosis not present

## 2019-07-11 DIAGNOSIS — M2042 Other hammer toe(s) (acquired), left foot: Secondary | ICD-10-CM | POA: Diagnosis not present

## 2019-07-26 DIAGNOSIS — M19072 Primary osteoarthritis, left ankle and foot: Secondary | ICD-10-CM | POA: Diagnosis not present

## 2019-07-30 DIAGNOSIS — E782 Mixed hyperlipidemia: Secondary | ICD-10-CM | POA: Diagnosis not present

## 2019-07-30 DIAGNOSIS — G4700 Insomnia, unspecified: Secondary | ICD-10-CM | POA: Diagnosis not present

## 2019-07-30 DIAGNOSIS — M130 Polyarthritis, unspecified: Secondary | ICD-10-CM | POA: Diagnosis not present

## 2019-07-30 DIAGNOSIS — I10 Essential (primary) hypertension: Secondary | ICD-10-CM | POA: Diagnosis not present

## 2019-07-30 DIAGNOSIS — R7303 Prediabetes: Secondary | ICD-10-CM | POA: Diagnosis not present

## 2019-07-30 DIAGNOSIS — D519 Vitamin B12 deficiency anemia, unspecified: Secondary | ICD-10-CM | POA: Diagnosis not present

## 2019-07-30 DIAGNOSIS — R5383 Other fatigue: Secondary | ICD-10-CM | POA: Diagnosis not present

## 2019-07-30 DIAGNOSIS — E559 Vitamin D deficiency, unspecified: Secondary | ICD-10-CM | POA: Diagnosis not present

## 2019-07-30 DIAGNOSIS — R799 Abnormal finding of blood chemistry, unspecified: Secondary | ICD-10-CM | POA: Diagnosis not present

## 2019-08-28 DIAGNOSIS — I10 Essential (primary) hypertension: Secondary | ICD-10-CM | POA: Diagnosis not present

## 2019-08-29 DIAGNOSIS — Q667 Congenital pes cavus, unspecified foot: Secondary | ICD-10-CM | POA: Diagnosis not present

## 2019-08-29 DIAGNOSIS — M7672 Peroneal tendinitis, left leg: Secondary | ICD-10-CM | POA: Diagnosis not present

## 2019-08-29 DIAGNOSIS — M19072 Primary osteoarthritis, left ankle and foot: Secondary | ICD-10-CM | POA: Diagnosis not present

## 2019-08-29 DIAGNOSIS — M249 Joint derangement, unspecified: Secondary | ICD-10-CM | POA: Diagnosis not present

## 2019-08-29 DIAGNOSIS — G8929 Other chronic pain: Secondary | ICD-10-CM | POA: Diagnosis not present

## 2019-08-29 DIAGNOSIS — M25572 Pain in left ankle and joints of left foot: Secondary | ICD-10-CM | POA: Diagnosis not present

## 2019-08-29 DIAGNOSIS — M25372 Other instability, left ankle: Secondary | ICD-10-CM | POA: Diagnosis not present

## 2019-08-31 ENCOUNTER — Other Ambulatory Visit (HOSPITAL_COMMUNITY): Payer: Self-pay | Admitting: Podiatry

## 2019-08-31 ENCOUNTER — Other Ambulatory Visit: Payer: Self-pay | Admitting: Podiatry

## 2019-08-31 DIAGNOSIS — M7672 Peroneal tendinitis, left leg: Secondary | ICD-10-CM

## 2019-09-06 ENCOUNTER — Ambulatory Visit: Payer: Medicare Other | Attending: Internal Medicine

## 2019-09-06 DIAGNOSIS — Z20822 Contact with and (suspected) exposure to covid-19: Secondary | ICD-10-CM

## 2019-09-07 LAB — NOVEL CORONAVIRUS, NAA: SARS-CoV-2, NAA: NOT DETECTED

## 2019-09-11 ENCOUNTER — Ambulatory Visit
Admission: RE | Admit: 2019-09-11 | Discharge: 2019-09-11 | Disposition: A | Discharge status: Home / Self Care | Payer: Medicare Other | Source: Ambulatory Visit | Attending: Podiatry | Admitting: Podiatry

## 2019-09-11 ENCOUNTER — Other Ambulatory Visit: Payer: Self-pay

## 2019-09-11 DIAGNOSIS — M19072 Primary osteoarthritis, left ankle and foot: Secondary | ICD-10-CM | POA: Diagnosis not present

## 2019-09-11 DIAGNOSIS — R600 Localized edema: Secondary | ICD-10-CM | POA: Diagnosis not present

## 2019-09-11 DIAGNOSIS — M7672 Peroneal tendinitis, left leg: Secondary | ICD-10-CM | POA: Insufficient documentation

## 2019-09-14 DIAGNOSIS — M25372 Other instability, left ankle: Secondary | ICD-10-CM | POA: Diagnosis not present

## 2019-09-14 DIAGNOSIS — M19072 Primary osteoarthritis, left ankle and foot: Secondary | ICD-10-CM | POA: Diagnosis not present

## 2019-09-14 DIAGNOSIS — M2041 Other hammer toe(s) (acquired), right foot: Secondary | ICD-10-CM | POA: Diagnosis not present

## 2019-09-14 DIAGNOSIS — M249 Joint derangement, unspecified: Secondary | ICD-10-CM | POA: Diagnosis not present

## 2019-09-14 DIAGNOSIS — M25572 Pain in left ankle and joints of left foot: Secondary | ICD-10-CM | POA: Diagnosis not present

## 2019-09-14 DIAGNOSIS — G8929 Other chronic pain: Secondary | ICD-10-CM | POA: Diagnosis not present

## 2019-09-14 DIAGNOSIS — M2042 Other hammer toe(s) (acquired), left foot: Secondary | ICD-10-CM | POA: Diagnosis not present

## 2019-09-14 DIAGNOSIS — Q667 Congenital pes cavus, unspecified foot: Secondary | ICD-10-CM | POA: Diagnosis not present

## 2019-09-18 ENCOUNTER — Other Ambulatory Visit: Payer: Self-pay | Admitting: Podiatry

## 2019-09-19 DIAGNOSIS — M5416 Radiculopathy, lumbar region: Secondary | ICD-10-CM | POA: Diagnosis not present

## 2019-09-19 DIAGNOSIS — M48061 Spinal stenosis, lumbar region without neurogenic claudication: Secondary | ICD-10-CM | POA: Diagnosis not present

## 2019-09-21 ENCOUNTER — Other Ambulatory Visit: Payer: Medicare Other

## 2019-09-26 ENCOUNTER — Encounter: Payer: Self-pay | Admitting: Podiatry

## 2019-09-26 ENCOUNTER — Other Ambulatory Visit: Payer: Self-pay

## 2019-09-28 ENCOUNTER — Other Ambulatory Visit: Payer: Self-pay

## 2019-09-28 ENCOUNTER — Other Ambulatory Visit
Admission: RE | Admit: 2019-09-28 | Discharge: 2019-09-28 | Disposition: A | Discharge status: Home / Self Care | Payer: Medicare Other | Source: Ambulatory Visit | Attending: Podiatry | Admitting: Podiatry

## 2019-09-28 DIAGNOSIS — Z01812 Encounter for preprocedural laboratory examination: Secondary | ICD-10-CM | POA: Insufficient documentation

## 2019-09-28 DIAGNOSIS — Z20822 Contact with and (suspected) exposure to covid-19: Secondary | ICD-10-CM | POA: Insufficient documentation

## 2019-09-28 LAB — SARS CORONAVIRUS 2 (TAT 6-24 HRS): SARS Coronavirus 2: NEGATIVE

## 2019-10-02 ENCOUNTER — Encounter: Payer: Self-pay | Admitting: Podiatry

## 2019-10-02 ENCOUNTER — Ambulatory Visit: Payer: Medicare Other | Admitting: Anesthesiology

## 2019-10-02 ENCOUNTER — Ambulatory Visit
Admission: RE | Admit: 2019-10-02 | Discharge: 2019-10-02 | Disposition: A | Discharge status: Home / Self Care | Payer: Medicare Other | Attending: Podiatry | Admitting: Podiatry

## 2019-10-02 ENCOUNTER — Other Ambulatory Visit: Payer: Self-pay

## 2019-10-02 ENCOUNTER — Encounter
Admission: RE | Disposition: A | Discharge status: Home / Self Care | Payer: Self-pay | Source: Home / Self Care | Attending: Podiatry

## 2019-10-02 DIAGNOSIS — G8929 Other chronic pain: Secondary | ICD-10-CM | POA: Insufficient documentation

## 2019-10-02 DIAGNOSIS — M2570 Osteophyte, unspecified joint: Secondary | ICD-10-CM | POA: Diagnosis not present

## 2019-10-02 DIAGNOSIS — Z87891 Personal history of nicotine dependence: Secondary | ICD-10-CM | POA: Diagnosis not present

## 2019-10-02 DIAGNOSIS — M25572 Pain in left ankle and joints of left foot: Secondary | ICD-10-CM | POA: Diagnosis not present

## 2019-10-02 DIAGNOSIS — I10 Essential (primary) hypertension: Secondary | ICD-10-CM | POA: Insufficient documentation

## 2019-10-02 DIAGNOSIS — Z8546 Personal history of malignant neoplasm of prostate: Secondary | ICD-10-CM | POA: Insufficient documentation

## 2019-10-02 DIAGNOSIS — E785 Hyperlipidemia, unspecified: Secondary | ICD-10-CM | POA: Insufficient documentation

## 2019-10-02 DIAGNOSIS — K219 Gastro-esophageal reflux disease without esophagitis: Secondary | ICD-10-CM | POA: Diagnosis not present

## 2019-10-02 DIAGNOSIS — M19072 Primary osteoarthritis, left ankle and foot: Secondary | ICD-10-CM | POA: Diagnosis not present

## 2019-10-02 DIAGNOSIS — G8918 Other acute postprocedural pain: Secondary | ICD-10-CM | POA: Diagnosis not present

## 2019-10-02 DIAGNOSIS — Q667 Congenital pes cavus, unspecified foot: Secondary | ICD-10-CM | POA: Insufficient documentation

## 2019-10-02 DIAGNOSIS — Z79899 Other long term (current) drug therapy: Secondary | ICD-10-CM | POA: Diagnosis not present

## 2019-10-02 DIAGNOSIS — M24072 Loose body in left ankle: Secondary | ICD-10-CM | POA: Diagnosis not present

## 2019-10-02 HISTORY — PX: ANKLE ARTHROSCOPY: SHX545

## 2019-10-02 SURGERY — ARTHROSCOPY, ANKLE
Anesthesia: Regional | Site: Ankle | Laterality: Left

## 2019-10-02 MED ORDER — FENTANYL CITRATE (PF) 100 MCG/2ML IJ SOLN: 25.0000 ug | INTRAMUSCULAR | Status: DC | PRN

## 2019-10-02 MED ORDER — HYDROCODONE-ACETAMINOPHEN 7.5-325 MG PO TABS: 1.0000 | ORAL_TABLET | Freq: Four times a day (QID) | ORAL | 0 refills | Status: AC | PRN

## 2019-10-02 MED ORDER — DEXAMETHASONE SODIUM PHOSPHATE 4 MG/ML IJ SOLN
INTRAMUSCULAR | Status: DC | PRN
  Administered 2019-10-02: 4 mg via INTRAVENOUS

## 2019-10-02 MED ORDER — ROPIVACAINE HCL 5 MG/ML IJ SOLN
INTRAMUSCULAR | Status: DC | PRN
  Administered 2019-10-02: 40 mL via PERINEURAL

## 2019-10-02 MED ORDER — OXYCODONE HCL 5 MG/5ML PO SOLN: 5.0000 mg | Freq: Once | ORAL | Status: DC | PRN

## 2019-10-02 MED ORDER — CEFAZOLIN SODIUM-DEXTROSE 2-4 GM/100ML-% IV SOLN
2.0000 g | INTRAVENOUS | Status: AC
  Administered 2019-10-02: 2 g via INTRAVENOUS

## 2019-10-02 MED ORDER — GLYCOPYRROLATE 0.2 MG/ML IJ SOLN
INTRAMUSCULAR | Status: DC | PRN
  Administered 2019-10-02 (×2): .1 mg via INTRAVENOUS

## 2019-10-02 MED ORDER — ACETAMINOPHEN 325 MG PO TABS: 325.0000 mg | ORAL_TABLET | ORAL | Status: DC | PRN

## 2019-10-02 MED ORDER — ONDANSETRON HCL 4 MG PO TABS: 4.0000 mg | ORAL_TABLET | Freq: Three times a day (TID) | ORAL | 0 refills | Status: AC | PRN

## 2019-10-02 MED ORDER — CEPHALEXIN 500 MG PO CAPS: 500.0000 mg | ORAL_CAPSULE | Freq: Three times a day (TID) | ORAL | 0 refills | Status: AC

## 2019-10-02 MED ORDER — FENTANYL CITRATE (PF) 100 MCG/2ML IJ SOLN
INTRAMUSCULAR | Status: DC | PRN
  Administered 2019-10-02: 100 ug via INTRAVENOUS

## 2019-10-02 MED ORDER — LACTATED RINGERS IV SOLN: INTRAVENOUS | Status: DC

## 2019-10-02 MED ORDER — MIDAZOLAM HCL 2 MG/2ML IJ SOLN
INTRAMUSCULAR | Status: DC | PRN
  Administered 2019-10-02: 2 mg via INTRAVENOUS

## 2019-10-02 MED ORDER — ACETAMINOPHEN 160 MG/5ML PO SOLN: 325.0000 mg | ORAL | Status: DC | PRN

## 2019-10-02 MED ORDER — EPHEDRINE SULFATE 50 MG/ML IJ SOLN
INTRAMUSCULAR | Status: DC | PRN
  Administered 2019-10-02 (×3): 10 mg via INTRAVENOUS

## 2019-10-02 MED ORDER — PROPOFOL 10 MG/ML IV BOLUS
INTRAVENOUS | Status: DC | PRN
  Administered 2019-10-02: 120 mg via INTRAVENOUS

## 2019-10-02 MED ORDER — OXYCODONE HCL 5 MG PO TABS: 5.0000 mg | ORAL_TABLET | Freq: Once | ORAL | Status: DC | PRN

## 2019-10-02 MED ORDER — ONDANSETRON HCL 4 MG/2ML IJ SOLN
INTRAMUSCULAR | Status: DC | PRN
  Administered 2019-10-02: 4 mg via INTRAVENOUS

## 2019-10-02 MED ORDER — ASPIRIN EC 325 MG PO TBEC: 325.0000 mg | DELAYED_RELEASE_TABLET | Freq: Every day | ORAL | 0 refills | Status: AC

## 2019-10-02 MED ORDER — POVIDONE-IODINE 7.5 % EX SOLN: Freq: Once | CUTANEOUS | Status: DC

## 2019-10-02 MED ORDER — LIDOCAINE HCL (CARDIAC) PF 100 MG/5ML IV SOSY
PREFILLED_SYRINGE | INTRAVENOUS | Status: DC | PRN
  Administered 2019-10-02: 30 mg via INTRATRACHEAL

## 2019-10-02 SURGICAL SUPPLY — 31 items
BNDG COHESIVE 4X5 TAN STRL (GAUZE/BANDAGES/DRESSINGS) ×4 IMPLANT
BNDG ELASTIC 4X5.8 VLCR STR LF (GAUZE/BANDAGES/DRESSINGS) ×4 IMPLANT
BNDG ESMARK 4X12 TAN STRL LF (GAUZE/BANDAGES/DRESSINGS) ×3 IMPLANT
BNDG GAUZE 4.5X4.1 6PLY STRL (MISCELLANEOUS) ×3 IMPLANT
BUR AGGRESSIVE+ 2.5 (BURR) ×3 IMPLANT
BUR RND 2.0 HOODED (BURR) ×2 IMPLANT
CUFF TOURN SGL QUICK 30 (TOURNIQUET CUFF) ×2
CUFF TRNQT CYL 30X4X21-28X (TOURNIQUET CUFF) IMPLANT
DRAPE STERI 35X30 U-POUCH (DRAPES) ×3 IMPLANT
DURAPREP 26ML APPLICATOR (WOUND CARE) ×3 IMPLANT
GAUZE SPONGE 4X4 12PLY STRL (GAUZE/BANDAGES/DRESSINGS) ×3 IMPLANT
GAUZE XEROFORM 1X8 LF (GAUZE/BANDAGES/DRESSINGS) ×3 IMPLANT
GLOVE BIO SURGEON STRL SZ7 (GLOVE) ×3 IMPLANT
GLOVE BIOGEL PI IND STRL 7.0 (GLOVE) ×1 IMPLANT
GLOVE BIOGEL PI INDICATOR 7.0 (GLOVE) ×2
GOWN STRL REUS W/ TWL LRG LVL3 (GOWN DISPOSABLE) ×2 IMPLANT
GOWN STRL REUS W/TWL LRG LVL3 (GOWN DISPOSABLE) ×4
IV LACTATED RINGER IRRG 3000ML (IV SOLUTION) ×8
IV LR IRRIG 3000ML ARTHROMATIC (IV SOLUTION) ×2 IMPLANT
KIT TURNOVER KIT A (KITS) ×3 IMPLANT
MANIFOLD 4PT FOR NEPTUNE1 (MISCELLANEOUS) ×3 IMPLANT
NDL SPNL 18GX3.5 QUINCKE PK (NEEDLE) IMPLANT
NEEDLE SPNL 18GX3.5 QUINCKE PK (NEEDLE) ×3 IMPLANT
PACK EXTREMITY ARMC (MISCELLANEOUS) ×3 IMPLANT
SET TUBE SUCT SHAVER OUTFL 24K (TUBING) ×2 IMPLANT
SPLINT CAST 1 STEP 4X30 (MISCELLANEOUS) ×3 IMPLANT
STOCKINETTE IMPERVIOUS LG (DRAPES) ×3 IMPLANT
STRAP ANKLE DISTRACTOR (MISCELLANEOUS) ×2 IMPLANT
STRAP BODY AND KNEE 60X3 (MISCELLANEOUS) ×3 IMPLANT
SUT ETHILON 4 0 PS 2 18 (SUTURE) ×2 IMPLANT
TUBING ARTHRO INFLOW-ONLY STRL (TUBING) ×3 IMPLANT

## 2019-10-02 NOTE — H&P (Signed)
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  The various methods of treatment have been discussed with the patient.  No guarantees were given.  After consideration of risks, benefits and other options for treatment, the patient has consented to surgery.  I have reviewed the patients' chart and labs.    PROCEDURE: LEFT ANKLE ARTHROSCOPY WITH EXTENSIVE DEBRIDEMENT AND REMOVAL OF BONE SPURS/LOOSE BODY  A history and physical examination was performed in my office.  The patient was reexamined.  There have been no changes to this history and physical examination.  Caroline More, DPM

## 2019-10-02 NOTE — Anesthesia Procedure Notes (Signed)
Anesthesia Regional Block: Adductor canal block   Pre-Anesthetic Checklist: ,, timeout performed, Correct Patient, Correct Site, Correct Laterality, Correct Procedure, Correct Position, site marked, Risks and benefits discussed,  Surgical consent,  Pre-op evaluation,  At surgeon's request and post-op pain management  Laterality: Left  Prep: chloraprep       Needles:  Injection technique: Single-shot  Needle Type: Stimiplex     Needle Length: 10cm  Needle Gauge: 21     Additional Needles:   Procedures:,,,, ultrasound used (permanent image in chart),,,,  Narrative:  Start time: 10/02/2019 11:05 AM End time: 10/02/2019 11:07 AM Injection made incrementally with aspirations every 5 mL. Anesthesiologist: Veda Canning, MD  Additional Notes: 40mL ropivacaine used in ACB

## 2019-10-02 NOTE — Discharge Instructions (Signed)
[facility]  POST OPERATIVE INSTRUCTIONS FOR DR. TROXLER, DR. FOWLER, AND DR. Tuyen Uncapher [facility]   1. Take your medication as prescribed.  Pain medication should be taken only as needed.  2. Keep the dressing clean, dry and intact.  3. Keep your foot elevated above the heart level for the first 48 hours.  4. Walking to the bathroom and brief periods of walking are acceptable, unless we have instructed you to be non-weight bearing.  5. Always wear your post-op shoe when walking.  Always use your crutches if you are to be non-weight bearing.  6. Do not take a shower. Baths are permissible as long as the foot is kept out of the water.   7. Every hour you are awake:  - Bend your knee 15 times. - Flex foot 15 times - Massage calf 15 times  8. Call [facility] ([phone]) if any of the following problems occur: - You develop a temperature or fever. - The bandage becomes saturated with blood. - Medication does not stop your pain. - Injury of the foot occurs. - Any symptoms of infection including redness, odor, or red streaks running from wound.   General Anesthesia, Adult, Care After This sheet gives you information about how to care for yourself after your procedure. Your health care provider may also give you more specific instructions. If you have problems or questions, contact your health care provider. What can I expect after the procedure? After the procedure, the following side effects are common:  Pain or discomfort at the IV site.  Nausea.  Vomiting.  Sore throat.  Trouble concentrating.  Feeling cold or chills.  Weak or tired.  Sleepiness and fatigue.  Soreness and body aches. These side effects can affect parts of the body that were not involved in surgery. Follow these instructions at home:  For at least 24 hours after the procedure:  Have a responsible adult stay with you. It is  important to have someone help care for you until you are awake and alert.  Rest as needed.  Do not: ? Participate in activities in which you could fall or become injured. ? Drive. ? Use heavy machinery. ? Drink alcohol. ? Take sleeping pills or medicines that cause drowsiness. ? Make important decisions or sign legal documents. ? Take care of children on your own. Eating and drinking  Follow any instructions from your health care provider about eating or drinking restrictions.  When you feel hungry, start by eating small amounts of foods that are soft and easy to digest (bland), such as toast. Gradually return to your regular diet.  Drink enough fluid to keep your urine pale yellow.  If you vomit, rehydrate by drinking water, juice, or clear broth. General instructions  If you have sleep apnea, surgery and certain medicines can increase your risk for breathing problems. Follow instructions from your health care provider about wearing your sleep device: ? Anytime you are sleeping, including during daytime naps. ? While taking prescription pain medicines, sleeping medicines, or medicines that make you drowsy.  Return to your normal activities as told by your health care provider. Ask your health care provider what activities are safe for you.  Take over-the-counter and prescription medicines only as told by your health care provider.  If you smoke, do not smoke without supervision.  Keep all follow-up visits as told by your health care provider. This is important. Contact a health care provider if:    You have nausea or vomiting that does not get better with medicine.  You cannot eat or drink without vomiting.  You have pain that does not get better with medicine.  You are unable to pass urine.  You develop a skin rash.  You have a fever.  You have redness around your IV site that gets worse. Get help right away if:  You have difficulty breathing.  You have chest  pain.  You have blood in your urine or stool, or you vomit blood. Summary  After the procedure, it is common to have a sore throat or nausea. It is also common to feel tired.  Have a responsible adult stay with you for the first 24 hours after general anesthesia. It is important to have someone help care for you until you are awake and alert.  When you feel hungry, start by eating small amounts of foods that are soft and easy to digest (bland), such as toast. Gradually return to your regular diet.  Drink enough fluid to keep your urine pale yellow.  Return to your normal activities as told by your health care provider. Ask your health care provider what activities are safe for you. This information is not intended to replace advice given to you by your health care provider. Make sure you discuss any questions you have with your health care provider. Document Revised: 07/22/2017 Document Reviewed: 03/04/2017 Elsevier Patient Education  2020 [employer]vier Inc[employer]  

## 2019-10-02 NOTE — Anesthesia Postprocedure Evaluation (Signed)
Anesthesia Post Note  Patient: Seth Baxter  Procedure(s) Performed: ANKLE ARTHROSCOPY/DEBRIDEMENT;EXTENSIVE LEFT (Left Ankle)     Patient location during evaluation: PACU Anesthesia Type: Regional and General Level of consciousness: awake Pain management: pain level controlled Vital Signs Assessment: post-procedure vital signs reviewed and stable Respiratory status: respiratory function stable Cardiovascular status: stable Postop Assessment: no signs of nausea or vomiting Anesthetic complications: no    Veda Canning

## 2019-10-02 NOTE — Anesthesia Procedure Notes (Signed)
Procedure Name: LMA Insertion Date/Time: 10/02/2019 12:07 PM Performed by: Cameron Ali, CRNA Pre-anesthesia Checklist: Patient identified, Emergency Drugs available, Suction available, Timeout performed and Patient being monitored Patient Re-evaluated:Patient Re-evaluated prior to induction Oxygen Delivery Method: Circle system utilized Preoxygenation: Pre-oxygenation with 100% oxygen Induction Type: IV induction LMA: LMA inserted LMA Size: 4.0 Number of attempts: 1 Placement Confirmation: positive ETCO2 and breath sounds checked- equal and bilateral Tube secured with: Tape Dental Injury: Teeth and Oropharynx as per pre-operative assessment

## 2019-10-02 NOTE — Op Note (Addendum)
PODIATRY / FOOT AND ANKLE SURGERY OPERATIVE REPORT    SURGEON: Caroline More, DPM  PRE-OPERATIVE DIAGNOSIS:  1.  Left ankle arthritis 2.  Left ankle synovitis with loose body at the posterior medial aspect 3.  Left ankle anterior distal tibial bone spur  POST-OPERATIVE DIAGNOSIS: Same  PROCEDURE(S): 1. Left ankle extensive arthroscopy with removal of loose body 2. Left anterior distal tibial bone spur resection  HEMOSTASIS: Left thigh tourniquet  ANESTHESIA: MAC  ESTIMATED BLOOD LOSS: 10 cc  FINDING(S): 1.  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 2.  Loose body present at the posterior medial aspect of the left ankle. 3.  Ankle synovitis with anterior distal tibial bone spur left  PATHOLOGY/SPECIMEN(S): None taken  INDICATIONS:   Seth Baxter is a 79 y.o. male who presents with chronic left ankle pain.  Patient has exhausted conservative therapies consisting of changes in shoe gear, orthoses, bracing/taping/strapping, physical therapy.  An MRI was performed showing severe ankle arthritis with anterior ankle bone spur with damage of the articular cartilage of both the talus and the tibia.  Patient also was noted to have a loose body at the posterior medial aspect of the ankle.  Discussed all treatment options with the patient both conservative and surgical attempts at correction including potential risks and complications of surgical intervention.  Discussed total ankle replacement versus ankle fusion versus ankle arthroscopy.  Patient has elected for the more conservative procedure at this time consisting of Left ankle arthroscopy with debridement of all synovitic tissue, removal of anterior tibial distal bone spur, and removal of loose body.  All questions answered and consent signed and placed in patient's chart.  Patient presents today for procedure..  DESCRIPTION: After obtaining full informed written consent, the patient was  brought back to the operating room and placed supine upon the operating table.  The patient received IV antibiotics prior to induction.  A thigh tourniquet was applied.  After obtaining adequate anesthesia, the patient was prepped and draped in the standard fashion.  An Esmarch bandage used to exsanguinate the left lower extremity and the pneumatic thigh tourniquet was inflated.  The patient was placed in the knee holder and the appropriate distal traction was applied with an external device to distract the ankle.  At this time an 18-gauge needle along with 10 cc of normal saline was injected into the ankle joint at the medial portal slightly medial to the tendon of the tibialis anterior at the level of the ankle joint.  The ankle joint was insufflated with 10 cc.  A small stab incision was then made over the area where the needle was placed medial to the tendon of the tibialis anterior and blunt dissection was continued down with a hemostat to the level of the ankle joint capsule which was punctured and the ankle joint fluid was able to be released indicating successful entry into the ankle joint.  The obturator and cannula was then inserted into the medial portal, the obturator was removed leaving the cannula in place and the ankle scope was then placed in the ankle joint.  The ankle joint was then insufflated and examined and noted to have extreme cartilage damage and synovitis throughout the entire ankle joint.  Attention was directed to the anterior lateral aspect of the ankle in which an 18-gauge needle was introduced lateral to the superficial peroneal nerve branch and the 18-gauge needle was able to be examined within the ankle joint.  The needle was removed and a small percutaneous incision was made at that area at the anterior lateral ankle joint and again blunt dissection was continued down with a hemostat until the ankle joint was able to be punctured and entered.  The shaver was then introduced to the  anterior lateral portal.  Debridement was performed extensively of the ankle joint at the anterior aspect as well as the anterior lateral aspect of the fibula removing all inflamed synovitic type tissue.  There appeared to be loose fragments of cartilage also floating in the joint which were also removed.  There appeared to be a very soft area at the dorsal lateral tibia which appeared to be degenerated.  When looking through the entire ankle joint approximately 60 to 70% of the cartilage appears to be completely eroded down to subchondral bone.  The instrumentation was then switched and corresponding portals and debridement was performed of the the medial aspect of the ankle joint removing all synovitic tissue extensively.  The medial malleolus appeared to be intact overall and this area appeared to have a lesser amount of cartilage damage compared to the rest of the joint but again approximately 60 to 70% of the total ankle joint did have cartilage erosion.  A blunt probe was then used to probe into the posterior medial aspect of the ankle and the loose body was able to be seen at that time.  A grasper was then introduced and the loose body was extracted and passed off in the operative site.  The cartilage at the posterior aspect of the ankle joint appeared to be relatively intact overall compared to the anterior, anteromedial, central, and anterior lateral aspect of the ankle joint which appeared to have the most cartilage damage present.  The talus also appeared to have loose areas of cartilage present which were removed and passed off the operative site.  Since there is so much global articular cartilage loss at the operative site no subchondral drilling was performed due to potential for fusing certain parts of the ankle.  Attention was directed to the anterior aspect of the ankle with the same instrumentation settings and a bur was then inserted to remove the spur that was present at the anterior distal  aspect of the tibia.  This was resected and the ankle joint was then brought through range of motion and noted to have adequate range of motion and increased compared to the preoperative state.  Further debridement was performed with a shaver removing all fibrous debris as well as any further inflammatory changes seen.  Final pictures were then taken once again showing the severe amount of articular damage to the ankle joint.  The instrumentation was removed after suction was performed removing all joint fluid.  The small portals at the anterior aspect the ankle were both closed with 3-0 nylon horizontal mattress and simple type stitching.  A postoperative dressing was applied consisting of Xeroform followed by 4 x 4 gauze, Kerlix, web roll, posterior splint, Ace wrap.  The pneumatic thigh tourniquet was deflated and a prompt hyperemic response was noted all digits left foot.  Following appear to postoperative monitoring the patient be discharged home with the following written oral postop instructions: Keep surgical dressings clean, dry, and intact, ice and elevate left lower extremity when at rest, remain nonweightbearing to left lower extremity all times, take postop pain medication, antibiotics, antinausea medicine, and aspirin as prescribed, work on knee flexion extension exercise as well as calf massages daily, and follow-up  in clinic within 1 week of surgical date.  Discussed with patient and patient's wife prior to and after the procedure that the ankle joint is likely to be severely damaged and he may need further surgical intervention in the future consisting of total ankle replacement versus ankle fusion.  Again patient wife understand and appreciate care.  COMPLICATIONS: None  CONDITION: Good, stable  Caroline More, DPM

## 2019-10-02 NOTE — Anesthesia Preprocedure Evaluation (Addendum)
Anesthesia Evaluation  Patient identified by MRN, date of birth, ID band Patient awake    Reviewed: Allergy & Precautions, NPO status   History of Anesthesia Complications (+) PONV  Airway Mallampati: II  TM Distance: >3 FB     Dental   Pulmonary former smoker,    breath sounds clear to auscultation       Cardiovascular hypertension,  Rhythm:Regular Rate:Normal  HLD   Neuro/Psych  Headaches,    GI/Hepatic GERD  ,  Endo/Other    Renal/GU      Musculoskeletal  (+) Arthritis ,   Abdominal   Peds  Hematology   Anesthesia Other Findings   Reproductive/Obstetrics                            Anesthesia Physical Anesthesia Plan  ASA: II  Anesthesia Plan: General and Regional   Post-op Pain Management:    Induction: Intravenous  PONV Risk Score and Plan: Treatment may vary due to age or medical condition, Dexamethasone and Ondansetron  Airway Management Planned: LMA  Additional Equipment:   Intra-op Plan:   Post-operative Plan:   Informed Consent: I have reviewed the patients History and Physical, chart, labs and discussed the procedure including the risks, benefits and alternatives for the proposed anesthesia with the patient or authorized representative who has indicated his/her understanding and acceptance.     Dental advisory given  Plan Discussed with: CRNA  Anesthesia Plan Comments:         Anesthesia Quick Evaluation

## 2019-10-02 NOTE — Progress Notes (Signed)
Assisted Seth Baxter, ANMD  with left, ultrasound guided, popliteal/saphenous block. Side rails up, monitors on throughout procedure. See vital signs in flow sheet. Tolerated Procedure well.  

## 2019-10-02 NOTE — Anesthesia Procedure Notes (Addendum)
Anesthesia Regional Block: Popliteal block   Pre-Anesthetic Checklist: ,, timeout performed, Correct Patient, Correct Site, Correct Laterality, Correct Procedure, Correct Position, site marked, Risks and benefits discussed,  Surgical consent,  Pre-op evaluation,  At surgeon's request and post-op pain management  Laterality: Left  Prep: chloraprep       Needles:  Injection technique: Single-shot  Needle Type: Stimiplex     Needle Length: 10cm  Needle Gauge: 21     Additional Needles:   Procedures:,,,, ultrasound used (permanent image in chart),,,,  Narrative:  Start time: 10/02/2019 10:59 AM End time: 10/02/2019 11:03 AM Injection made incrementally with aspirations every 5 mL. Anesthesiologist: Veda Canning, MD  Additional Notes: 64mL used in popliteal block

## 2019-10-02 NOTE — Transfer of Care (Signed)
Immediate Anesthesia Transfer of Care Note  Patient: Seth Baxter  Procedure(s) Performed: ANKLE ARTHROSCOPY/DEBRIDEMENT;EXTENSIVE LEFT (Left Ankle)  Patient Location: PACU  Anesthesia Type: General, Regional  Level of Consciousness: awake, alert  and patient cooperative  Airway and Oxygen Therapy: Patient Spontanous Breathing and Patient connected to supplemental oxygen  Post-op Assessment: Post-op Vital signs reviewed, Patient's Cardiovascular Status Stable, Respiratory Function Stable, Patent Airway and No signs of Nausea or vomiting  Post-op Vital Signs: Reviewed and stable  Complications: No apparent anesthesia complications

## 2019-10-03 ENCOUNTER — Ambulatory Visit: Payer: Self-pay | Admitting: Urology

## 2019-10-08 DIAGNOSIS — M19072 Primary osteoarthritis, left ankle and foot: Secondary | ICD-10-CM | POA: Diagnosis not present

## 2019-10-22 DIAGNOSIS — M6281 Muscle weakness (generalized): Secondary | ICD-10-CM | POA: Diagnosis not present

## 2019-10-22 DIAGNOSIS — G8929 Other chronic pain: Secondary | ICD-10-CM | POA: Diagnosis not present

## 2019-10-22 DIAGNOSIS — M25572 Pain in left ankle and joints of left foot: Secondary | ICD-10-CM | POA: Diagnosis not present

## 2019-10-22 DIAGNOSIS — M25672 Stiffness of left ankle, not elsewhere classified: Secondary | ICD-10-CM | POA: Diagnosis not present

## 2019-11-16 DIAGNOSIS — M25572 Pain in left ankle and joints of left foot: Secondary | ICD-10-CM | POA: Diagnosis not present

## 2019-11-16 DIAGNOSIS — G8929 Other chronic pain: Secondary | ICD-10-CM | POA: Diagnosis not present

## 2019-11-16 DIAGNOSIS — M19072 Primary osteoarthritis, left ankle and foot: Secondary | ICD-10-CM | POA: Diagnosis not present

## 2019-11-20 DIAGNOSIS — M5442 Lumbago with sciatica, left side: Secondary | ICD-10-CM | POA: Insufficient documentation

## 2019-11-20 DIAGNOSIS — M545 Low back pain, unspecified: Secondary | ICD-10-CM | POA: Insufficient documentation

## 2019-11-20 DIAGNOSIS — M48061 Spinal stenosis, lumbar region without neurogenic claudication: Secondary | ICD-10-CM | POA: Diagnosis not present

## 2019-11-20 DIAGNOSIS — G8929 Other chronic pain: Secondary | ICD-10-CM | POA: Diagnosis not present

## 2019-11-20 DIAGNOSIS — M48062 Spinal stenosis, lumbar region with neurogenic claudication: Secondary | ICD-10-CM | POA: Diagnosis not present

## 2019-11-20 DIAGNOSIS — M5441 Lumbago with sciatica, right side: Secondary | ICD-10-CM | POA: Diagnosis not present

## 2019-11-20 DIAGNOSIS — M5416 Radiculopathy, lumbar region: Secondary | ICD-10-CM | POA: Insufficient documentation

## 2019-11-29 DIAGNOSIS — R7303 Prediabetes: Secondary | ICD-10-CM | POA: Diagnosis not present

## 2019-11-29 DIAGNOSIS — E119 Type 2 diabetes mellitus without complications: Secondary | ICD-10-CM | POA: Diagnosis not present

## 2019-11-29 DIAGNOSIS — E782 Mixed hyperlipidemia: Secondary | ICD-10-CM | POA: Diagnosis not present

## 2019-11-29 DIAGNOSIS — Z125 Encounter for screening for malignant neoplasm of prostate: Secondary | ICD-10-CM | POA: Diagnosis not present

## 2019-11-29 DIAGNOSIS — K219 Gastro-esophageal reflux disease without esophagitis: Secondary | ICD-10-CM | POA: Diagnosis not present

## 2019-11-29 DIAGNOSIS — I10 Essential (primary) hypertension: Secondary | ICD-10-CM | POA: Diagnosis not present

## 2019-12-07 DIAGNOSIS — E782 Mixed hyperlipidemia: Secondary | ICD-10-CM | POA: Diagnosis not present

## 2019-12-07 DIAGNOSIS — E559 Vitamin D deficiency, unspecified: Secondary | ICD-10-CM | POA: Diagnosis not present

## 2019-12-07 DIAGNOSIS — I10 Essential (primary) hypertension: Secondary | ICD-10-CM | POA: Diagnosis not present

## 2019-12-07 DIAGNOSIS — D510 Vitamin B12 deficiency anemia due to intrinsic factor deficiency: Secondary | ICD-10-CM | POA: Diagnosis not present

## 2019-12-07 DIAGNOSIS — E039 Hypothyroidism, unspecified: Secondary | ICD-10-CM | POA: Diagnosis not present

## 2019-12-07 DIAGNOSIS — R7303 Prediabetes: Secondary | ICD-10-CM | POA: Diagnosis not present

## 2019-12-17 DIAGNOSIS — M5442 Lumbago with sciatica, left side: Secondary | ICD-10-CM | POA: Diagnosis not present

## 2019-12-17 DIAGNOSIS — M5441 Lumbago with sciatica, right side: Secondary | ICD-10-CM | POA: Diagnosis not present

## 2019-12-17 DIAGNOSIS — G8929 Other chronic pain: Secondary | ICD-10-CM | POA: Diagnosis not present

## 2020-01-03 DIAGNOSIS — M5416 Radiculopathy, lumbar region: Secondary | ICD-10-CM | POA: Diagnosis not present

## 2020-01-03 DIAGNOSIS — M48061 Spinal stenosis, lumbar region without neurogenic claudication: Secondary | ICD-10-CM | POA: Diagnosis not present

## 2020-01-28 DIAGNOSIS — M85871 Other specified disorders of bone density and structure, right ankle and foot: Secondary | ICD-10-CM | POA: Diagnosis not present

## 2020-01-28 DIAGNOSIS — M7989 Other specified soft tissue disorders: Secondary | ICD-10-CM | POA: Diagnosis not present

## 2020-01-28 DIAGNOSIS — M19072 Primary osteoarthritis, left ankle and foot: Secondary | ICD-10-CM | POA: Diagnosis not present

## 2020-01-28 DIAGNOSIS — M25572 Pain in left ankle and joints of left foot: Secondary | ICD-10-CM | POA: Diagnosis not present

## 2020-01-29 ENCOUNTER — Other Ambulatory Visit: Payer: Self-pay | Admitting: Physician Assistant

## 2020-01-29 DIAGNOSIS — M19072 Primary osteoarthritis, left ankle and foot: Secondary | ICD-10-CM

## 2020-02-08 ENCOUNTER — Other Ambulatory Visit: Payer: Self-pay

## 2020-02-08 ENCOUNTER — Ambulatory Visit
Admission: RE | Admit: 2020-02-08 | Discharge: 2020-02-08 | Disposition: A | Discharge status: Home / Self Care | Payer: Medicare Other | Source: Ambulatory Visit | Attending: Physician Assistant | Admitting: Physician Assistant

## 2020-02-08 DIAGNOSIS — M19072 Primary osteoarthritis, left ankle and foot: Secondary | ICD-10-CM | POA: Insufficient documentation

## 2020-02-14 DIAGNOSIS — M85672 Other cyst of bone, left ankle and foot: Secondary | ICD-10-CM | POA: Diagnosis not present

## 2020-02-14 DIAGNOSIS — M7672 Peroneal tendinitis, left leg: Secondary | ICD-10-CM | POA: Diagnosis not present

## 2020-02-14 DIAGNOSIS — M21072 Valgus deformity, not elsewhere classified, left ankle: Secondary | ICD-10-CM | POA: Diagnosis not present

## 2020-02-14 DIAGNOSIS — M12572 Traumatic arthropathy, left ankle and foot: Secondary | ICD-10-CM | POA: Diagnosis not present

## 2020-03-24 DIAGNOSIS — M48061 Spinal stenosis, lumbar region without neurogenic claudication: Secondary | ICD-10-CM | POA: Diagnosis not present

## 2020-03-24 DIAGNOSIS — M5136 Other intervertebral disc degeneration, lumbar region: Secondary | ICD-10-CM | POA: Diagnosis not present

## 2020-05-28 ENCOUNTER — Ambulatory Visit (INDEPENDENT_AMBULATORY_CARE_PROVIDER_SITE_OTHER): Payer: Medicare Other | Admitting: Urology

## 2020-05-28 ENCOUNTER — Other Ambulatory Visit: Payer: Self-pay

## 2020-05-28 ENCOUNTER — Encounter: Payer: Self-pay | Admitting: Urology

## 2020-05-28 VITALS — BP 144/73 | HR 58 | Ht 63.0 in | Wt 130.0 lb

## 2020-05-28 DIAGNOSIS — N3281 Overactive bladder: Secondary | ICD-10-CM | POA: Diagnosis not present

## 2020-05-28 DIAGNOSIS — N5231 Erectile dysfunction following radical prostatectomy: Secondary | ICD-10-CM

## 2020-05-28 DIAGNOSIS — Z8546 Personal history of malignant neoplasm of prostate: Secondary | ICD-10-CM | POA: Diagnosis not present
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
## 2020-05-30 DIAGNOSIS — I10 Essential (primary) hypertension: Secondary | ICD-10-CM | POA: Diagnosis not present

## 2020-05-30 DIAGNOSIS — K219 Gastro-esophageal reflux disease without esophagitis: Secondary | ICD-10-CM | POA: Diagnosis not present

## 2020-05-30 DIAGNOSIS — E119 Type 2 diabetes mellitus without complications: Secondary | ICD-10-CM | POA: Diagnosis not present

## 2020-05-30 DIAGNOSIS — Z23 Encounter for immunization: Secondary | ICD-10-CM | POA: Diagnosis not present

## 2020-05-30 DIAGNOSIS — E782 Mixed hyperlipidemia: Secondary | ICD-10-CM | POA: Diagnosis not present

## 2020-05-30 DIAGNOSIS — G4762 Sleep related leg cramps: Secondary | ICD-10-CM | POA: Diagnosis not present

## 2020-05-30 DIAGNOSIS — R7303 Prediabetes: Secondary | ICD-10-CM | POA: Diagnosis not present

## 2020-06-06 DIAGNOSIS — G4762 Sleep related leg cramps: Secondary | ICD-10-CM | POA: Diagnosis not present

## 2020-06-06 DIAGNOSIS — E119 Type 2 diabetes mellitus without complications: Secondary | ICD-10-CM | POA: Diagnosis not present

## 2020-06-06 DIAGNOSIS — I10 Essential (primary) hypertension: Secondary | ICD-10-CM | POA: Diagnosis not present

## 2020-06-06 DIAGNOSIS — E782 Mixed hyperlipidemia: Secondary | ICD-10-CM | POA: Diagnosis not present

## 2020-06-09 DIAGNOSIS — M19072 Primary osteoarthritis, left ankle and foot: Secondary | ICD-10-CM | POA: Diagnosis not present

## 2020-06-13 ENCOUNTER — Other Ambulatory Visit: Payer: Self-pay | Admitting: Urology

## 2020-06-13 ENCOUNTER — Encounter: Payer: Self-pay | Admitting: Urology

## 2020-06-13 MED ORDER — SILDENAFIL CITRATE 100 MG PO TABS: 100.0000 mg | ORAL_TABLET | Freq: Every day | ORAL | 0 refills | Status: DC | PRN

## 2020-06-19 DIAGNOSIS — E782 Mixed hyperlipidemia: Secondary | ICD-10-CM | POA: Diagnosis not present

## 2020-06-19 DIAGNOSIS — K253 Acute gastric ulcer without hemorrhage or perforation: Secondary | ICD-10-CM | POA: Diagnosis not present

## 2020-06-19 DIAGNOSIS — K219 Gastro-esophageal reflux disease without esophagitis: Secondary | ICD-10-CM | POA: Diagnosis not present

## 2020-06-19 DIAGNOSIS — E119 Type 2 diabetes mellitus without complications: Secondary | ICD-10-CM | POA: Diagnosis not present

## 2020-06-20 DIAGNOSIS — K253 Acute gastric ulcer without hemorrhage or perforation: Secondary | ICD-10-CM | POA: Diagnosis not present

## 2020-06-23 DIAGNOSIS — R1010 Upper abdominal pain, unspecified: Secondary | ICD-10-CM | POA: Diagnosis not present

## 2020-06-23 DIAGNOSIS — K921 Melena: Secondary | ICD-10-CM | POA: Diagnosis not present

## 2020-06-23 DIAGNOSIS — R112 Nausea with vomiting, unspecified: Secondary | ICD-10-CM | POA: Diagnosis not present

## 2020-06-27 ENCOUNTER — Encounter: Payer: Self-pay | Admitting: Urology

## 2020-06-30 ENCOUNTER — Other Ambulatory Visit: Payer: Self-pay | Admitting: *Deleted

## 2020-06-30 MED ORDER — MIRABEGRON ER 25 MG PO TB24: 25.0000 mg | ORAL_TABLET | Freq: Every day | ORAL | 12 refills | Status: DC

## 2020-07-01 ENCOUNTER — Other Ambulatory Visit: Payer: Self-pay | Admitting: *Deleted

## 2020-07-01 NOTE — Telephone Encounter (Signed)
Myrbetriq is not cover. He can try oxybutynin ,solifenacin, tolterodine

## 2020-07-02 NOTE — Telephone Encounter (Signed)
Please notify patient and send in Rx solifenacin 10 mg daily-30 day trial

## 2020-07-07 DIAGNOSIS — Z01818 Encounter for other preprocedural examination: Secondary | ICD-10-CM | POA: Diagnosis not present

## 2020-07-07 DIAGNOSIS — M19072 Primary osteoarthritis, left ankle and foot: Secondary | ICD-10-CM | POA: Diagnosis not present

## 2020-07-07 DIAGNOSIS — G8929 Other chronic pain: Secondary | ICD-10-CM | POA: Diagnosis not present

## 2020-07-07 DIAGNOSIS — M8949 Other hypertrophic osteoarthropathy, multiple sites: Secondary | ICD-10-CM | POA: Diagnosis not present

## 2020-07-07 DIAGNOSIS — M25572 Pain in left ankle and joints of left foot: Secondary | ICD-10-CM | POA: Diagnosis not present

## 2020-07-07 DIAGNOSIS — Z20822 Contact with and (suspected) exposure to covid-19: Secondary | ICD-10-CM | POA: Diagnosis not present

## 2020-07-08 DIAGNOSIS — G8929 Other chronic pain: Secondary | ICD-10-CM | POA: Diagnosis not present

## 2020-07-08 DIAGNOSIS — K219 Gastro-esophageal reflux disease without esophagitis: Secondary | ICD-10-CM | POA: Diagnosis not present

## 2020-07-08 DIAGNOSIS — E7800 Pure hypercholesterolemia, unspecified: Secondary | ICD-10-CM | POA: Diagnosis not present

## 2020-07-08 DIAGNOSIS — E119 Type 2 diabetes mellitus without complications: Secondary | ICD-10-CM | POA: Diagnosis not present

## 2020-07-08 DIAGNOSIS — M19029 Primary osteoarthritis, unspecified elbow: Secondary | ICD-10-CM | POA: Diagnosis not present

## 2020-07-08 DIAGNOSIS — M1711 Unilateral primary osteoarthritis, right knee: Secondary | ICD-10-CM | POA: Diagnosis not present

## 2020-07-08 DIAGNOSIS — Z9079 Acquired absence of other genital organ(s): Secondary | ICD-10-CM | POA: Diagnosis not present

## 2020-07-08 DIAGNOSIS — M1611 Unilateral primary osteoarthritis, right hip: Secondary | ICD-10-CM | POA: Diagnosis not present

## 2020-07-08 DIAGNOSIS — Z7982 Long term (current) use of aspirin: Secondary | ICD-10-CM | POA: Diagnosis not present

## 2020-07-08 DIAGNOSIS — M6702 Short Achilles tendon (acquired), left ankle: Secondary | ICD-10-CM | POA: Diagnosis not present

## 2020-07-08 DIAGNOSIS — M21072 Valgus deformity, not elsewhere classified, left ankle: Secondary | ICD-10-CM | POA: Diagnosis not present

## 2020-07-08 DIAGNOSIS — Z8546 Personal history of malignant neoplasm of prostate: Secondary | ICD-10-CM | POA: Diagnosis not present

## 2020-07-08 DIAGNOSIS — I10 Essential (primary) hypertension: Secondary | ICD-10-CM | POA: Diagnosis not present

## 2020-07-08 DIAGNOSIS — Z79899 Other long term (current) drug therapy: Secondary | ICD-10-CM | POA: Diagnosis not present

## 2020-07-08 DIAGNOSIS — Z87891 Personal history of nicotine dependence: Secondary | ICD-10-CM | POA: Diagnosis not present

## 2020-07-08 DIAGNOSIS — M180 Bilateral primary osteoarthritis of first carpometacarpal joints: Secondary | ICD-10-CM | POA: Diagnosis not present

## 2020-07-08 DIAGNOSIS — M19072 Primary osteoarthritis, left ankle and foot: Secondary | ICD-10-CM | POA: Diagnosis not present

## 2020-07-14 ENCOUNTER — Encounter: Payer: Self-pay | Admitting: Urology

## 2020-07-17 ENCOUNTER — Other Ambulatory Visit: Payer: Self-pay | Admitting: Family Medicine

## 2020-07-17 MED ORDER — SOLIFENACIN SUCCINATE 10 MG PO TABS: 10.0000 mg | ORAL_TABLET | Freq: Every day | ORAL | 0 refills | Status: DC

## 2020-07-17 NOTE — Telephone Encounter (Signed)
Spoke to patient and informed him that Myrbetriq was denied. Per [facility] he is to try Solifenacin 10 mg. Patient is to contact our office if the medication does not work and try to get an appeal.

## 2020-07-31 ENCOUNTER — Encounter: Payer: Self-pay | Admitting: Urology

## 2020-08-08 ENCOUNTER — Other Ambulatory Visit: Payer: Self-pay | Admitting: Urology

## 2020-08-08 MED ORDER — TROSPIUM CHLORIDE 20 MG PO TABS: 20.0000 mg | ORAL_TABLET | Freq: Two times a day (BID) | ORAL | 11 refills | Status: DC

## 2020-08-12 DIAGNOSIS — E119 Type 2 diabetes mellitus without complications: Secondary | ICD-10-CM | POA: Diagnosis not present

## 2020-08-12 DIAGNOSIS — G4700 Insomnia, unspecified: Secondary | ICD-10-CM | POA: Diagnosis not present

## 2020-08-12 DIAGNOSIS — I10 Essential (primary) hypertension: Secondary | ICD-10-CM | POA: Diagnosis not present

## 2020-08-12 DIAGNOSIS — E782 Mixed hyperlipidemia: Secondary | ICD-10-CM | POA: Diagnosis not present

## 2020-08-12 DIAGNOSIS — K2100 Gastro-esophageal reflux disease with esophagitis, without bleeding: Secondary | ICD-10-CM | POA: Diagnosis not present

## 2020-08-15 DIAGNOSIS — K921 Melena: Secondary | ICD-10-CM | POA: Diagnosis not present

## 2020-08-18 DIAGNOSIS — M48061 Spinal stenosis, lumbar region without neurogenic claudication: Secondary | ICD-10-CM | POA: Diagnosis not present

## 2020-08-20 DIAGNOSIS — M19072 Primary osteoarthritis, left ankle and foot: Secondary | ICD-10-CM | POA: Diagnosis not present

## 2020-08-20 DIAGNOSIS — M48061 Spinal stenosis, lumbar region without neurogenic claudication: Secondary | ICD-10-CM | POA: Diagnosis not present

## 2020-08-20 DIAGNOSIS — M5136 Other intervertebral disc degeneration, lumbar region: Secondary | ICD-10-CM | POA: Diagnosis not present

## 2020-10-01 DIAGNOSIS — M19072 Primary osteoarthritis, left ankle and foot: Secondary | ICD-10-CM | POA: Diagnosis not present

## 2020-10-29 ENCOUNTER — Other Ambulatory Visit: Payer: Self-pay

## 2020-10-29 ENCOUNTER — Ambulatory Visit (INDEPENDENT_AMBULATORY_CARE_PROVIDER_SITE_OTHER): Payer: Medicare Other | Admitting: Podiatry

## 2020-10-29 ENCOUNTER — Encounter: Payer: Self-pay | Admitting: Podiatry

## 2020-10-29 DIAGNOSIS — M19079 Primary osteoarthritis, unspecified ankle and foot: Secondary | ICD-10-CM

## 2020-10-29 DIAGNOSIS — M19071 Primary osteoarthritis, right ankle and foot: Secondary | ICD-10-CM | POA: Diagnosis not present

## 2020-10-29 DIAGNOSIS — M19072 Primary osteoarthritis, left ankle and foot: Secondary | ICD-10-CM
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
## 2020-11-06 DIAGNOSIS — M5136 Other intervertebral disc degeneration, lumbar region: Secondary | ICD-10-CM | POA: Diagnosis not present

## 2020-11-06 DIAGNOSIS — M5416 Radiculopathy, lumbar region: Secondary | ICD-10-CM | POA: Diagnosis not present

## 2020-11-06 DIAGNOSIS — M48061 Spinal stenosis, lumbar region without neurogenic claudication: Secondary | ICD-10-CM | POA: Diagnosis not present

## 2020-12-03 ENCOUNTER — Ambulatory Visit (INDEPENDENT_AMBULATORY_CARE_PROVIDER_SITE_OTHER): Payer: Medicare Other

## 2020-12-03 ENCOUNTER — Other Ambulatory Visit: Payer: Self-pay

## 2020-12-03 DIAGNOSIS — M19079 Primary osteoarthritis, unspecified ankle and foot: Secondary | ICD-10-CM

## 2020-12-03 NOTE — Patient Instructions (Signed)

## 2020-12-03 NOTE — Progress Notes (Signed)
Patient presents for orthotic pick up.  Verbal and written break in and wear instructions given.  Patient will follow up in 4 weeks with Dr if symptoms worsen or fail to improve. 

## 2020-12-04 DIAGNOSIS — G4700 Insomnia, unspecified: Secondary | ICD-10-CM | POA: Diagnosis not present

## 2020-12-04 DIAGNOSIS — G43109 Migraine with aura, not intractable, without status migrainosus: Secondary | ICD-10-CM | POA: Diagnosis not present

## 2020-12-04 DIAGNOSIS — I10 Essential (primary) hypertension: Secondary | ICD-10-CM | POA: Diagnosis not present

## 2020-12-04 DIAGNOSIS — E782 Mixed hyperlipidemia: Secondary | ICD-10-CM | POA: Diagnosis not present

## 2020-12-04 DIAGNOSIS — E119 Type 2 diabetes mellitus without complications: Secondary | ICD-10-CM | POA: Diagnosis not present

## 2020-12-16 ENCOUNTER — Other Ambulatory Visit: Payer: Self-pay | Admitting: Family Medicine

## 2020-12-16 MED ORDER — SILDENAFIL CITRATE 100 MG PO TABS: 100.0000 mg | ORAL_TABLET | Freq: Every day | ORAL | 0 refills | Status: DC | PRN

## 2021-01-05 ENCOUNTER — Ambulatory Visit (INDEPENDENT_AMBULATORY_CARE_PROVIDER_SITE_OTHER): Payer: Medicare Other | Admitting: Podiatry

## 2021-01-05 ENCOUNTER — Other Ambulatory Visit: Payer: Self-pay

## 2021-01-05 ENCOUNTER — Encounter: Payer: Self-pay | Admitting: Podiatry

## 2021-01-05 DIAGNOSIS — M216X2 Other acquired deformities of left foot: Secondary | ICD-10-CM

## 2021-01-05 DIAGNOSIS — M19079 Primary osteoarthritis, unspecified ankle and foot: Secondary | ICD-10-CM

## 2021-01-05 DIAGNOSIS — M7752 Other enthesopathy of left foot: Secondary | ICD-10-CM

## 2021-01-05 NOTE — Progress Notes (Signed)
He presents today for follow-up of his pronation to his left foot.  He states that is really basically the same of the orthotics he can wear because it hurts me needs my hip and my back.  He states have been wearing them in the shoes here as he points to the sketchers that he is currently wearing which are somewhat older.  Objective: Vital signs are stable he is alert oriented x3 foot is relatively unchanged that he does have some pain beneath the fifth metatarsal head of the left foot it does not demonstrate any type of soft tissue lesion though it does demonstrate significant fat pad atrophy.  Assessment chronic pain left foot overpronation left foot.  Plan Evaluating the shoes that he has a day he has a stability pair of sketchers running shoes.  Though they have worn and the retrocalcaneal cup is soft I think this has a whole lot to do with how his orthotics are functioning.  I recommended that he consider a Brooks or new balance with a stronger heel counter.  And I recommended that he purchase a neutral pair of shoes.  I would like to follow-up with him on an as-needed basis.

## 2021-01-09 DIAGNOSIS — E039 Hypothyroidism, unspecified: Secondary | ICD-10-CM | POA: Diagnosis not present

## 2021-01-09 DIAGNOSIS — I10 Essential (primary) hypertension: Secondary | ICD-10-CM | POA: Diagnosis not present

## 2021-01-09 DIAGNOSIS — J301 Allergic rhinitis due to pollen: Secondary | ICD-10-CM | POA: Diagnosis not present

## 2021-01-09 DIAGNOSIS — E559 Vitamin D deficiency, unspecified: Secondary | ICD-10-CM | POA: Diagnosis not present

## 2021-01-09 DIAGNOSIS — E782 Mixed hyperlipidemia: Secondary | ICD-10-CM | POA: Diagnosis not present

## 2021-01-09 DIAGNOSIS — E119 Type 2 diabetes mellitus without complications: Secondary | ICD-10-CM | POA: Diagnosis not present

## 2021-01-09 DIAGNOSIS — R7303 Prediabetes: Secondary | ICD-10-CM | POA: Diagnosis not present

## 2021-01-14 DIAGNOSIS — I10 Essential (primary) hypertension: Secondary | ICD-10-CM | POA: Diagnosis not present

## 2021-01-14 DIAGNOSIS — E782 Mixed hyperlipidemia: Secondary | ICD-10-CM | POA: Diagnosis not present

## 2021-01-14 DIAGNOSIS — L255 Unspecified contact dermatitis due to plants, except food: Secondary | ICD-10-CM | POA: Diagnosis not present

## 2021-02-04 ENCOUNTER — Ambulatory Visit: Payer: Medicare Other | Admitting: Podiatry

## 2021-02-16 DIAGNOSIS — Z800 Family history of malignant neoplasm of digestive organs: Secondary | ICD-10-CM | POA: Diagnosis not present

## 2021-02-16 DIAGNOSIS — R195 Other fecal abnormalities: Secondary | ICD-10-CM | POA: Diagnosis not present

## 2021-02-16 DIAGNOSIS — K219 Gastro-esophageal reflux disease without esophagitis: Secondary | ICD-10-CM | POA: Diagnosis not present

## 2021-02-16 DIAGNOSIS — R1013 Epigastric pain: Secondary | ICD-10-CM | POA: Diagnosis not present

## 2021-02-16 DIAGNOSIS — Z791 Long term (current) use of non-steroidal anti-inflammatories (NSAID): Secondary | ICD-10-CM | POA: Diagnosis not present

## 2021-03-01 DIAGNOSIS — U071 COVID-19: Secondary | ICD-10-CM | POA: Diagnosis not present

## 2021-03-19 DIAGNOSIS — M48061 Spinal stenosis, lumbar region without neurogenic claudication: Secondary | ICD-10-CM | POA: Diagnosis not present

## 2021-03-19 DIAGNOSIS — M5416 Radiculopathy, lumbar region: Secondary | ICD-10-CM | POA: Diagnosis not present

## 2021-03-19 DIAGNOSIS — M5136 Other intervertebral disc degeneration, lumbar region: Secondary | ICD-10-CM | POA: Diagnosis not present

## 2021-04-01 ENCOUNTER — Other Ambulatory Visit: Payer: Self-pay

## 2021-04-01 ENCOUNTER — Other Ambulatory Visit: Payer: Medicare Other

## 2021-04-03 DIAGNOSIS — M5416 Radiculopathy, lumbar region: Secondary | ICD-10-CM | POA: Diagnosis not present

## 2021-04-07 DIAGNOSIS — E782 Mixed hyperlipidemia: Secondary | ICD-10-CM | POA: Diagnosis not present

## 2021-04-07 DIAGNOSIS — I10 Essential (primary) hypertension: Secondary | ICD-10-CM | POA: Diagnosis not present

## 2021-04-07 DIAGNOSIS — E119 Type 2 diabetes mellitus without complications: Secondary | ICD-10-CM | POA: Diagnosis not present

## 2021-04-07 DIAGNOSIS — F5101 Primary insomnia: Secondary | ICD-10-CM | POA: Diagnosis not present

## 2021-04-21 DIAGNOSIS — S39012A Strain of muscle, fascia and tendon of lower back, initial encounter: Secondary | ICD-10-CM | POA: Diagnosis not present

## 2021-05-26 DIAGNOSIS — Z23 Encounter for immunization: Secondary | ICD-10-CM | POA: Diagnosis not present

## 2021-06-19 DIAGNOSIS — K5730 Diverticulosis of large intestine without perforation or abscess without bleeding: Secondary | ICD-10-CM | POA: Diagnosis not present

## 2021-06-19 DIAGNOSIS — R195 Other fecal abnormalities: Secondary | ICD-10-CM | POA: Diagnosis not present

## 2021-06-19 DIAGNOSIS — K2100 Gastro-esophageal reflux disease with esophagitis, without bleeding: Secondary | ICD-10-CM | POA: Diagnosis not present

## 2021-06-19 DIAGNOSIS — K2970 Gastritis, unspecified, without bleeding: Secondary | ICD-10-CM | POA: Diagnosis not present

## 2021-06-19 DIAGNOSIS — K222 Esophageal obstruction: Secondary | ICD-10-CM | POA: Diagnosis not present

## 2021-06-19 DIAGNOSIS — K640 First degree hemorrhoids: Secondary | ICD-10-CM | POA: Diagnosis not present

## 2021-06-19 DIAGNOSIS — K449 Diaphragmatic hernia without obstruction or gangrene: Secondary | ICD-10-CM | POA: Diagnosis not present

## 2021-06-19 DIAGNOSIS — Z791 Long term (current) use of non-steroidal anti-inflammatories (NSAID): Secondary | ICD-10-CM | POA: Diagnosis not present

## 2021-06-19 DIAGNOSIS — K219 Gastro-esophageal reflux disease without esophagitis: Secondary | ICD-10-CM | POA: Diagnosis not present

## 2021-07-06 DIAGNOSIS — Z96662 Presence of left artificial ankle joint: Secondary | ICD-10-CM | POA: Diagnosis not present

## 2021-07-06 DIAGNOSIS — M25572 Pain in left ankle and joints of left foot: Secondary | ICD-10-CM | POA: Diagnosis not present

## 2021-07-07 DIAGNOSIS — Z20822 Contact with and (suspected) exposure to covid-19: Secondary | ICD-10-CM | POA: Diagnosis not present

## 2021-07-16 DIAGNOSIS — M5416 Radiculopathy, lumbar region: Secondary | ICD-10-CM | POA: Diagnosis not present

## 2021-07-16 DIAGNOSIS — M48061 Spinal stenosis, lumbar region without neurogenic claudication: Secondary | ICD-10-CM | POA: Diagnosis not present

## 2021-07-16 DIAGNOSIS — M5136 Other intervertebral disc degeneration, lumbar region: Secondary | ICD-10-CM | POA: Diagnosis not present

## 2021-07-21 DIAGNOSIS — I10 Essential (primary) hypertension: Secondary | ICD-10-CM | POA: Diagnosis not present

## 2021-07-21 DIAGNOSIS — E782 Mixed hyperlipidemia: Secondary | ICD-10-CM | POA: Diagnosis not present

## 2021-07-21 DIAGNOSIS — E119 Type 2 diabetes mellitus without complications: Secondary | ICD-10-CM | POA: Diagnosis not present

## 2021-07-24 DIAGNOSIS — M170 Bilateral primary osteoarthritis of knee: Secondary | ICD-10-CM | POA: Diagnosis not present

## 2021-07-31 DIAGNOSIS — M5416 Radiculopathy, lumbar region: Secondary | ICD-10-CM | POA: Diagnosis not present

## 2021-08-06 DIAGNOSIS — M25561 Pain in right knee: Secondary | ICD-10-CM | POA: Diagnosis not present

## 2021-08-07 DIAGNOSIS — G43109 Migraine with aura, not intractable, without status migrainosus: Secondary | ICD-10-CM | POA: Diagnosis not present

## 2021-08-07 DIAGNOSIS — E782 Mixed hyperlipidemia: Secondary | ICD-10-CM | POA: Diagnosis not present

## 2021-08-07 DIAGNOSIS — I10 Essential (primary) hypertension: Secondary | ICD-10-CM | POA: Diagnosis not present

## 2021-08-07 DIAGNOSIS — E1165 Type 2 diabetes mellitus with hyperglycemia: Secondary | ICD-10-CM | POA: Diagnosis not present

## 2021-08-14 ENCOUNTER — Other Ambulatory Visit: Payer: Self-pay

## 2021-08-14 ENCOUNTER — Ambulatory Visit (INDEPENDENT_AMBULATORY_CARE_PROVIDER_SITE_OTHER): Payer: Medicare Other | Admitting: Urology

## 2021-08-14 ENCOUNTER — Encounter: Payer: Self-pay | Admitting: Urology

## 2021-08-14 VITALS — BP 137/80 | HR 80 | Ht 63.0 in | Wt 125.0 lb

## 2021-08-14 DIAGNOSIS — Z8546 Personal history of malignant neoplasm of prostate: Secondary | ICD-10-CM

## 2021-08-14 DIAGNOSIS — N5231 Erectile dysfunction following radical prostatectomy: Secondary | ICD-10-CM | POA: Diagnosis not present

## 2021-08-14 DIAGNOSIS — N3281 Overactive bladder: Secondary | ICD-10-CM | POA: Diagnosis not present

## 2021-08-14 MED ORDER — SILDENAFIL CITRATE 100 MG PO TABS: 100.0000 mg | ORAL_TABLET | Freq: Every day | ORAL | 3 refills | Status: DC | PRN
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
## 2021-08-26 DIAGNOSIS — Z20822 Contact with and (suspected) exposure to covid-19: Secondary | ICD-10-CM | POA: Diagnosis not present

## 2021-10-14 DIAGNOSIS — M48061 Spinal stenosis, lumbar region without neurogenic claudication: Secondary | ICD-10-CM | POA: Diagnosis not present

## 2021-10-14 DIAGNOSIS — M5136 Other intervertebral disc degeneration, lumbar region: Secondary | ICD-10-CM | POA: Diagnosis not present

## 2021-10-14 DIAGNOSIS — E119 Type 2 diabetes mellitus without complications: Secondary | ICD-10-CM | POA: Diagnosis not present

## 2021-10-14 DIAGNOSIS — M5416 Radiculopathy, lumbar region: Secondary | ICD-10-CM | POA: Diagnosis not present

## 2021-10-21 DIAGNOSIS — L821 Other seborrheic keratosis: Secondary | ICD-10-CM | POA: Diagnosis not present

## 2021-10-21 DIAGNOSIS — L648 Other androgenic alopecia: Secondary | ICD-10-CM | POA: Diagnosis not present

## 2021-11-01 DIAGNOSIS — Z20822 Contact with and (suspected) exposure to covid-19: Secondary | ICD-10-CM | POA: Diagnosis not present

## 2021-11-04 IMAGING — CT CT ANKLE*L* W/O CM
4 of 6 series · 16 of 33 positions shown, 18 images · non-contrast
Comparison: None.

CLINICAL DATA: Left lateral ankle pain since September 2019.

EXAM:
CT OF THE LEFT ANKLE WITHOUT CONTRAST
TECHNIQUE: Multidetector CT imaging of the left ankle was performed according
to the standard protocol. Multiplanar CT image reconstructions were
also generated.

[Series 5: axial bone sfov lower extremity 1.50 ax · axial · 0.30mm/px · z∈[+687,+782]mm · 5 of 182 slices shown, 7 images]
[im 31/182  soft-tissue]
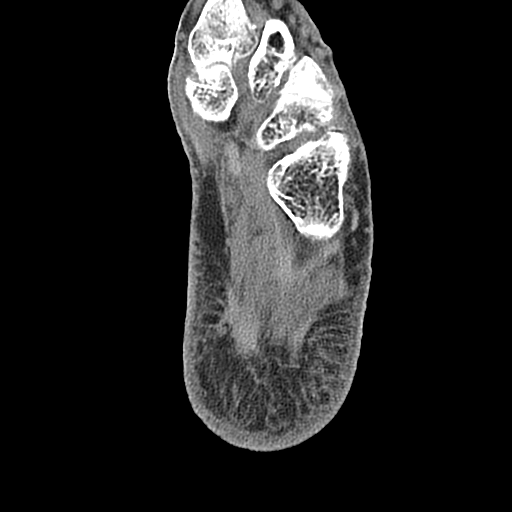
[im 31/182  bone]
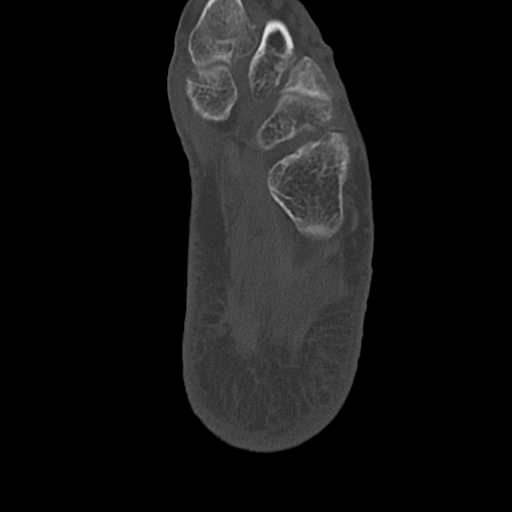
[im 61/182  bone]
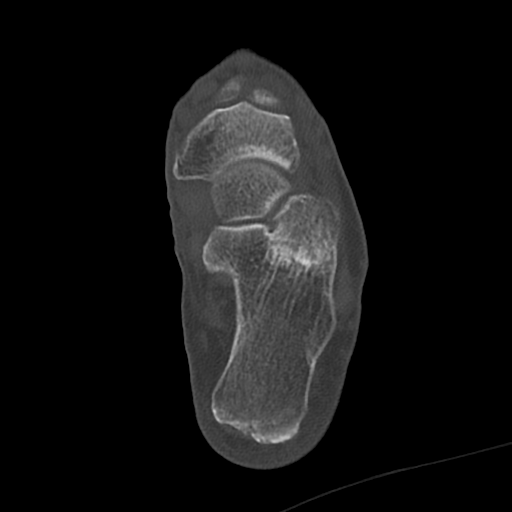
[im 91/182  bone]
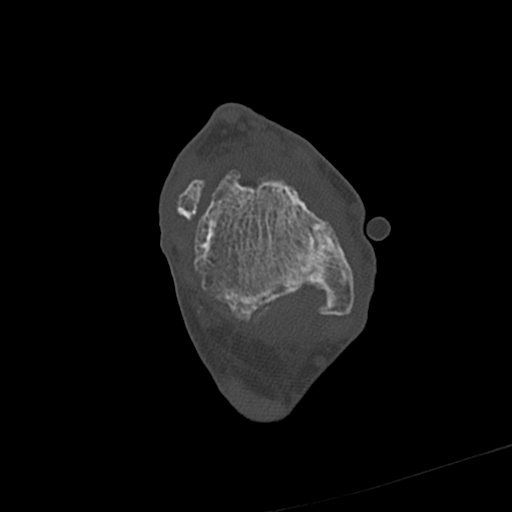
[im 121/182  bone]
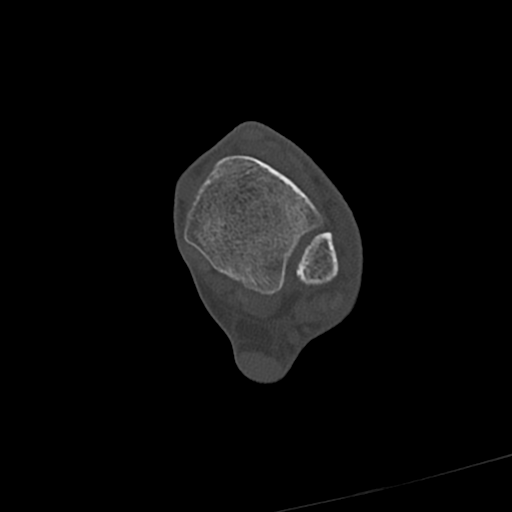
[im 151/182  soft-tissue]
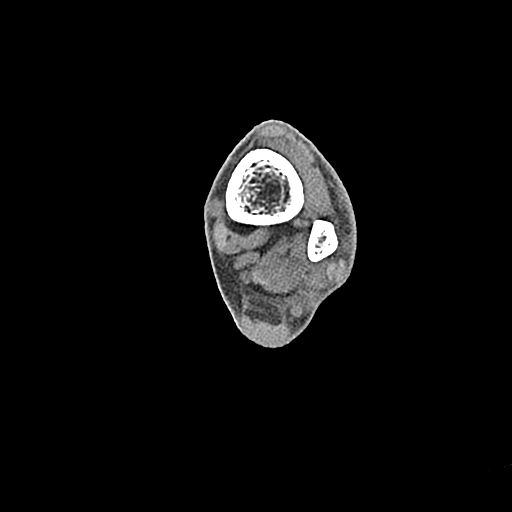
[im 151/182  bone]
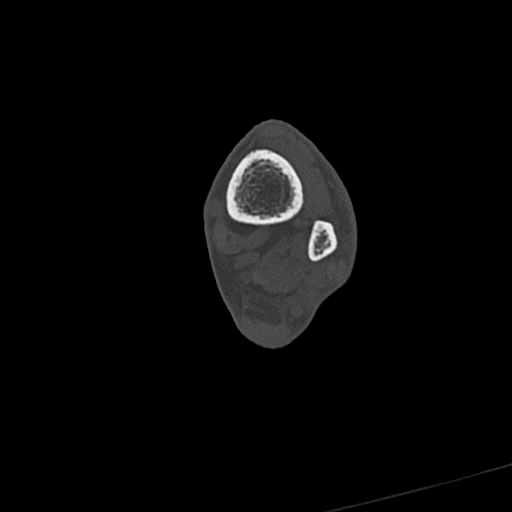

[Series 7: axial st sfov lower extremity 1.50 ax · axial · 0.30mm/px · z∈[+687,+782]mm · 5 of 182 slices shown]
[im 31/182  bone]
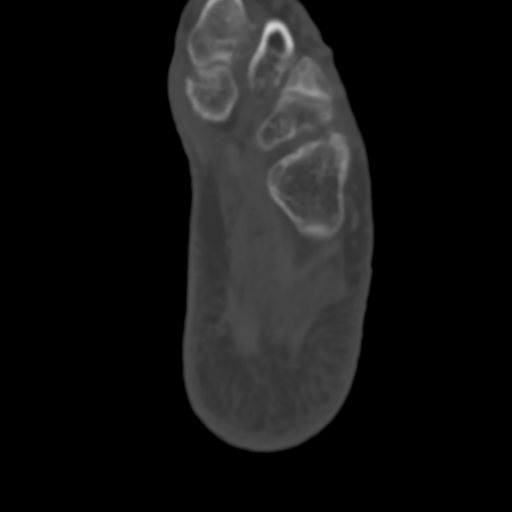
[im 61/182  bone]
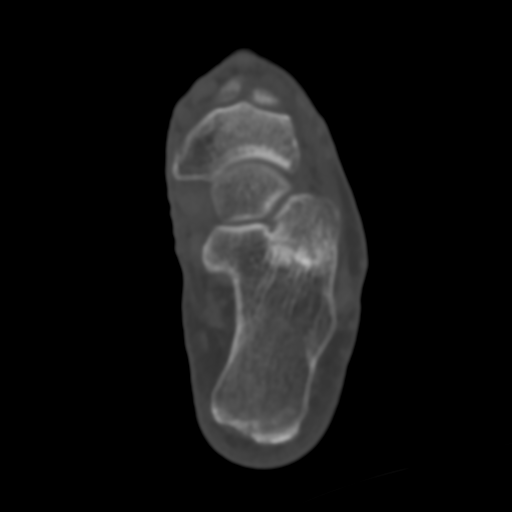
[im 91/182  bone]
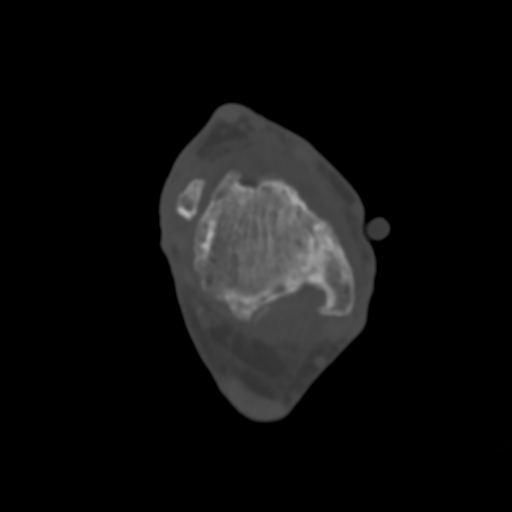
[im 121/182  bone]
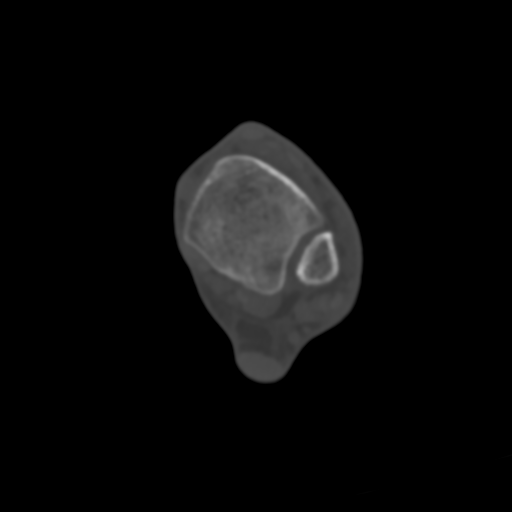
[im 151/182  bone]
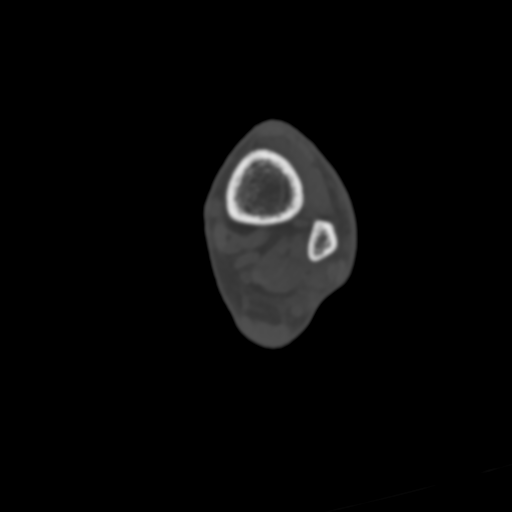

[Series 9: coronal bone sfov lower extremity 1.50 cor · coronal · 0.29mm/px · 1 of 186 slices shown]
[im 93/186  bone]
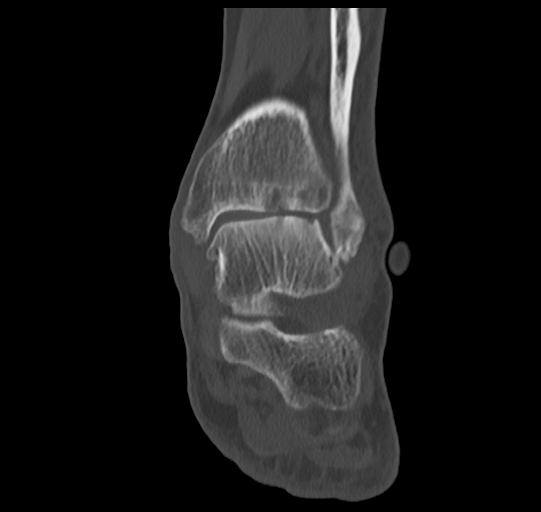

[Series 13: sagittal bone sfov lower extremity 1.50 sag · sagittal · 0.29mm/px · 5 of 93 slices shown]
[im 16/93  bone]
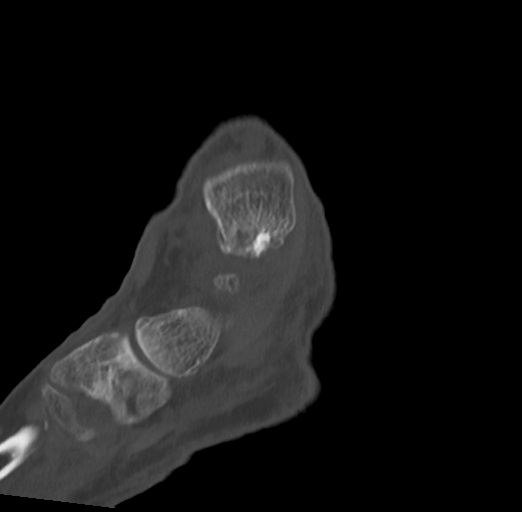
[im 31/93  bone]
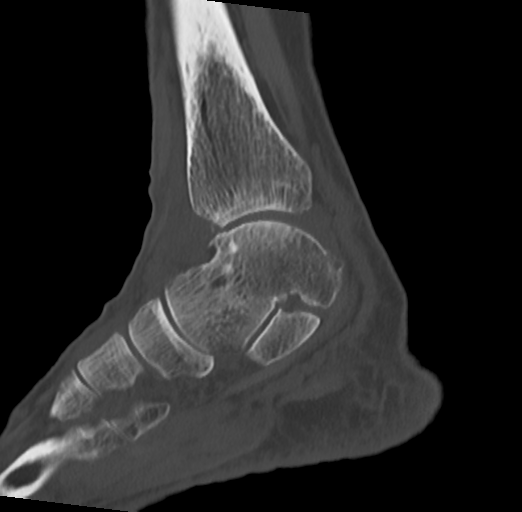
[im 47/93  bone]
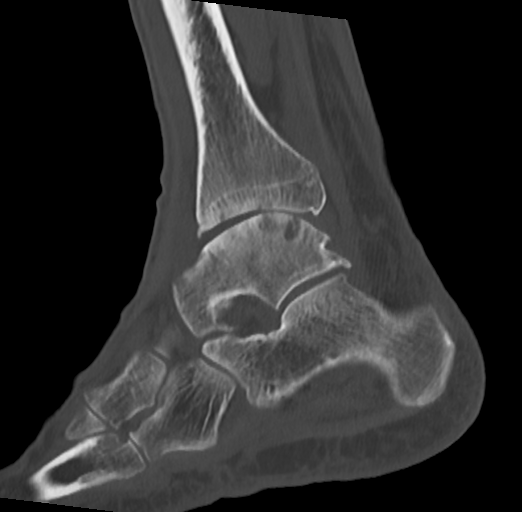
[im 62/93  bone]
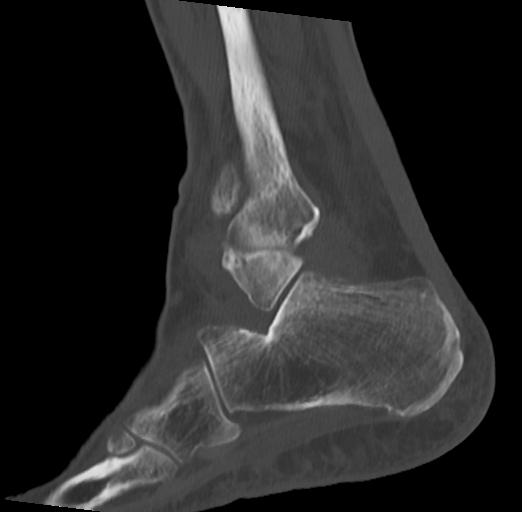
[im 77/93  bone]
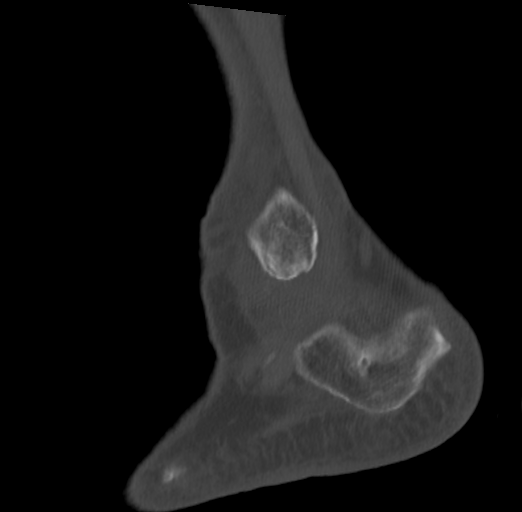

[16 of 33 positions shown; findings below may reference images not displayed]

FINDINGS: Bones/Joint/Cartilage

Generalized osteopenia. No fracture or dislocation. Normal
alignment.

Severe lateral tibiotalar joint space narrowing with subchondral
sclerosis and subchondral cystic changes. Severe fibulotalar joint
space narrowing with subchondral sclerosis and subchondral cystic
changes. Large ankle joint effusion.

Subtalar joints are unremarkable. Remainder the joint spaces are
relatively well maintained.

No aggressive osseous lesion.

Ligaments

Ligaments are suboptimally evaluated by CT.

Muscles and Tendons
Muscles are normal. Flexor, extensor, peroneal and Achilles tendons
are grossly intact.

Soft tissue
No fluid collection or hematoma.  No soft tissue mass.
IMPRESSION: Severe osteoarthritis of the left ankle with a large joint effusion.

## 2021-11-13 DIAGNOSIS — M159 Polyosteoarthritis, unspecified: Secondary | ICD-10-CM | POA: Diagnosis not present

## 2021-11-13 DIAGNOSIS — M180 Bilateral primary osteoarthritis of first carpometacarpal joints: Secondary | ICD-10-CM | POA: Diagnosis not present

## 2021-12-04 DIAGNOSIS — M130 Polyarthritis, unspecified: Secondary | ICD-10-CM | POA: Diagnosis not present

## 2021-12-04 DIAGNOSIS — E782 Mixed hyperlipidemia: Secondary | ICD-10-CM | POA: Diagnosis not present

## 2021-12-04 DIAGNOSIS — N529 Male erectile dysfunction, unspecified: Secondary | ICD-10-CM | POA: Diagnosis not present

## 2021-12-04 DIAGNOSIS — E1165 Type 2 diabetes mellitus with hyperglycemia: Secondary | ICD-10-CM | POA: Diagnosis not present

## 2021-12-04 DIAGNOSIS — G4762 Sleep related leg cramps: Secondary | ICD-10-CM | POA: Diagnosis not present

## 2021-12-04 DIAGNOSIS — E119 Type 2 diabetes mellitus without complications: Secondary | ICD-10-CM | POA: Diagnosis not present

## 2021-12-04 DIAGNOSIS — G43109 Migraine with aura, not intractable, without status migrainosus: Secondary | ICD-10-CM | POA: Diagnosis not present

## 2021-12-04 DIAGNOSIS — I10 Essential (primary) hypertension: Secondary | ICD-10-CM | POA: Diagnosis not present

## 2021-12-07 ENCOUNTER — Other Ambulatory Visit: Payer: Self-pay | Admitting: *Deleted

## 2021-12-07 MED ORDER — TROSPIUM CHLORIDE 20 MG PO TABS: 20.0000 mg | ORAL_TABLET | Freq: Two times a day (BID) | ORAL | 11 refills | Status: DC

## 2021-12-14 DIAGNOSIS — E1165 Type 2 diabetes mellitus with hyperglycemia: Secondary | ICD-10-CM | POA: Diagnosis not present

## 2021-12-14 DIAGNOSIS — E782 Mixed hyperlipidemia: Secondary | ICD-10-CM | POA: Diagnosis not present

## 2021-12-14 DIAGNOSIS — I10 Essential (primary) hypertension: Secondary | ICD-10-CM | POA: Diagnosis not present

## 2021-12-14 DIAGNOSIS — G43109 Migraine with aura, not intractable, without status migrainosus: Secondary | ICD-10-CM | POA: Diagnosis not present

## 2021-12-16 ENCOUNTER — Ambulatory Visit: Payer: Medicare Other

## 2021-12-16 ENCOUNTER — Ambulatory Visit (INDEPENDENT_AMBULATORY_CARE_PROVIDER_SITE_OTHER): Payer: Medicare Other | Admitting: Podiatry

## 2021-12-16 DIAGNOSIS — M7752 Other enthesopathy of left foot: Secondary | ICD-10-CM | POA: Diagnosis not present

## 2021-12-16 DIAGNOSIS — D2372 Other benign neoplasm of skin of left lower limb, including hip: Secondary | ICD-10-CM

## 2021-12-16 MED ORDER — DEXAMETHASONE SODIUM PHOSPHATE 120 MG/30ML IJ SOLN
2.0000 mg | Freq: Once | INTRAMUSCULAR | Status: AC
  Administered 2021-12-16: 2 mg via INTRA_ARTICULAR

## 2021-12-16 NOTE — Progress Notes (Signed)
Seth Baxter presents today chief complaint of a painful area overlying his fifth metatarsal base of his left foot.  He states his callused area is very tender and he feels fluid he underneath here. ? ?Objective: Vital signs are stable he is alert and oriented x3.  Pulses are palpable.  He has a palpable bursitis beneath the fifth metatarsal base and lateral to it.  There is porokeratotic lesion associated with it as well. ? ?Assessment: Benign skin lesion lateral aspect fifth metatarsal base and bursitis. ? ?Plan: I went ahead and debrided the area today after injecting it with dexamethasone and local anesthetic into the bursa.  Follow-up with him on an as-needed basis ?

## 2021-12-18 DIAGNOSIS — M5416 Radiculopathy, lumbar region: Secondary | ICD-10-CM | POA: Diagnosis not present

## 2021-12-28 DIAGNOSIS — M48061 Spinal stenosis, lumbar region without neurogenic claudication: Secondary | ICD-10-CM | POA: Diagnosis not present

## 2021-12-28 DIAGNOSIS — M47816 Spondylosis without myelopathy or radiculopathy, lumbar region: Secondary | ICD-10-CM | POA: Diagnosis not present

## 2021-12-28 DIAGNOSIS — M5416 Radiculopathy, lumbar region: Secondary | ICD-10-CM | POA: Diagnosis not present

## 2021-12-28 DIAGNOSIS — M4316 Spondylolisthesis, lumbar region: Secondary | ICD-10-CM | POA: Diagnosis not present

## 2021-12-28 DIAGNOSIS — K5730 Diverticulosis of large intestine without perforation or abscess without bleeding: Secondary | ICD-10-CM | POA: Diagnosis not present

## 2022-01-13 DIAGNOSIS — E119 Type 2 diabetes mellitus without complications: Secondary | ICD-10-CM | POA: Diagnosis not present

## 2022-01-13 DIAGNOSIS — M5416 Radiculopathy, lumbar region: Secondary | ICD-10-CM | POA: Diagnosis not present

## 2022-01-29 DIAGNOSIS — M5416 Radiculopathy, lumbar region: Secondary | ICD-10-CM | POA: Diagnosis not present

## 2022-02-18 DIAGNOSIS — M5136 Other intervertebral disc degeneration, lumbar region: Secondary | ICD-10-CM | POA: Diagnosis not present

## 2022-02-18 DIAGNOSIS — M545 Low back pain, unspecified: Secondary | ICD-10-CM | POA: Diagnosis not present

## 2022-02-18 DIAGNOSIS — M5416 Radiculopathy, lumbar region: Secondary | ICD-10-CM | POA: Diagnosis not present

## 2022-02-18 DIAGNOSIS — M4310 Spondylolisthesis, site unspecified: Secondary | ICD-10-CM | POA: Diagnosis not present

## 2022-02-18 DIAGNOSIS — M48062 Spinal stenosis, lumbar region with neurogenic claudication: Secondary | ICD-10-CM | POA: Diagnosis not present

## 2022-03-11 DIAGNOSIS — M48062 Spinal stenosis, lumbar region with neurogenic claudication: Secondary | ICD-10-CM | POA: Diagnosis not present

## 2022-03-11 DIAGNOSIS — M5416 Radiculopathy, lumbar region: Secondary | ICD-10-CM | POA: Diagnosis not present

## 2022-03-12 DIAGNOSIS — B029 Zoster without complications: Secondary | ICD-10-CM | POA: Diagnosis not present

## 2022-03-12 DIAGNOSIS — K253 Acute gastric ulcer without hemorrhage or perforation: Secondary | ICD-10-CM | POA: Diagnosis not present

## 2022-04-06 DIAGNOSIS — E782 Mixed hyperlipidemia: Secondary | ICD-10-CM | POA: Diagnosis not present

## 2022-04-06 DIAGNOSIS — E119 Type 2 diabetes mellitus without complications: Secondary | ICD-10-CM | POA: Diagnosis not present

## 2022-04-06 DIAGNOSIS — I10 Essential (primary) hypertension: Secondary | ICD-10-CM | POA: Diagnosis not present

## 2022-04-06 DIAGNOSIS — G43109 Migraine with aura, not intractable, without status migrainosus: Secondary | ICD-10-CM | POA: Diagnosis not present

## 2022-04-14 DIAGNOSIS — G43109 Migraine with aura, not intractable, without status migrainosus: Secondary | ICD-10-CM | POA: Diagnosis not present

## 2022-04-14 DIAGNOSIS — I10 Essential (primary) hypertension: Secondary | ICD-10-CM | POA: Diagnosis not present

## 2022-04-14 DIAGNOSIS — E119 Type 2 diabetes mellitus without complications: Secondary | ICD-10-CM | POA: Diagnosis not present

## 2022-04-14 DIAGNOSIS — E782 Mixed hyperlipidemia: Secondary | ICD-10-CM | POA: Diagnosis not present

## 2022-04-15 DIAGNOSIS — Z0000 Encounter for general adult medical examination without abnormal findings: Secondary | ICD-10-CM | POA: Diagnosis not present

## 2022-04-15 DIAGNOSIS — Z471 Aftercare following joint replacement surgery: Secondary | ICD-10-CM | POA: Diagnosis not present

## 2022-04-15 DIAGNOSIS — I10 Essential (primary) hypertension: Secondary | ICD-10-CM | POA: Diagnosis not present

## 2022-04-15 DIAGNOSIS — Z1331 Encounter for screening for depression: Secondary | ICD-10-CM | POA: Diagnosis not present

## 2022-04-15 DIAGNOSIS — E782 Mixed hyperlipidemia: Secondary | ICD-10-CM | POA: Diagnosis not present

## 2022-04-15 DIAGNOSIS — Z96662 Presence of left artificial ankle joint: Secondary | ICD-10-CM | POA: Diagnosis not present

## 2022-04-15 DIAGNOSIS — E119 Type 2 diabetes mellitus without complications: Secondary | ICD-10-CM | POA: Diagnosis not present

## 2022-04-15 DIAGNOSIS — Z1211 Encounter for screening for malignant neoplasm of colon: Secondary | ICD-10-CM | POA: Diagnosis not present

## 2022-04-15 DIAGNOSIS — Z1339 Encounter for screening examination for other mental health and behavioral disorders: Secondary | ICD-10-CM | POA: Diagnosis not present

## 2022-04-22 DIAGNOSIS — M545 Low back pain, unspecified: Secondary | ICD-10-CM | POA: Diagnosis not present

## 2022-04-22 DIAGNOSIS — Z23 Encounter for immunization: Secondary | ICD-10-CM | POA: Diagnosis not present

## 2022-04-22 DIAGNOSIS — M48062 Spinal stenosis, lumbar region with neurogenic claudication: Secondary | ICD-10-CM | POA: Diagnosis not present

## 2022-04-22 DIAGNOSIS — M5136 Other intervertebral disc degeneration, lumbar region: Secondary | ICD-10-CM | POA: Diagnosis not present

## 2022-04-22 DIAGNOSIS — M5416 Radiculopathy, lumbar region: Secondary | ICD-10-CM | POA: Diagnosis not present

## 2022-04-22 DIAGNOSIS — M4310 Spondylolisthesis, site unspecified: Secondary | ICD-10-CM | POA: Diagnosis not present

## 2022-04-22 DIAGNOSIS — M25512 Pain in left shoulder: Secondary | ICD-10-CM | POA: Diagnosis not present

## 2022-04-28 DIAGNOSIS — Z23 Encounter for immunization: Secondary | ICD-10-CM | POA: Diagnosis not present

## 2022-05-31 DIAGNOSIS — J069 Acute upper respiratory infection, unspecified: Secondary | ICD-10-CM | POA: Diagnosis not present

## 2022-05-31 DIAGNOSIS — J019 Acute sinusitis, unspecified: Secondary | ICD-10-CM | POA: Diagnosis not present

## 2022-06-16 DIAGNOSIS — M5416 Radiculopathy, lumbar region: Secondary | ICD-10-CM | POA: Diagnosis not present

## 2022-06-17 DIAGNOSIS — Z1152 Encounter for screening for COVID-19: Secondary | ICD-10-CM | POA: Diagnosis not present

## 2022-06-17 DIAGNOSIS — Z20822 Contact with and (suspected) exposure to covid-19: Secondary | ICD-10-CM | POA: Diagnosis not present

## 2022-06-30 ENCOUNTER — Encounter: Payer: Self-pay | Admitting: Podiatry

## 2022-06-30 ENCOUNTER — Ambulatory Visit (INDEPENDENT_AMBULATORY_CARE_PROVIDER_SITE_OTHER): Payer: Medicare Other | Admitting: Podiatry

## 2022-06-30 DIAGNOSIS — M2170 Unequal limb length (acquired), unspecified site: Secondary | ICD-10-CM

## 2022-06-30 NOTE — Progress Notes (Signed)
He presents today for follow-up of his ankle pain left.  He has seen Dr. Debby Bud and has had a joint replacement and was recently released from Dr. Debby Bud.  He states that he got the orthotics and tried those which really did not help he also got ones from [facility] for which she has really done nothing for him either his biggest concern is the limp that he has from his left lower extremity he is concerned that there may be a slight leg length discrepancy but he states that he really just cannot get the full extension in that left ankle like he could prior to his joint replacement.  Objective: Vital signs are stable he is alert and oriented x 3.  Pulses are palpable.  He has great dorsiflexion and good plantarflexion of the left ankle.  He does appear to have a slight limb length discrepancy and that the right leg seems to be a bit shorter this very well could cause a limp however I do think that the fact that he does not have full extension or plantarflexion at the ankle joint may very well be causing a limp as well.  He is frustrated because he cannot run.  Assessment: Possible leg length discrepancy.  Plan: Regarding investigate as to whether or not we can get a CT scanogram performed to evaluate for leg length.

## 2022-07-01 NOTE — Addendum Note (Signed)
Addended by: Rip Harbour on: 07/01/2022 08:49 AM   Modules accepted: Orders

## 2022-07-05 ENCOUNTER — Ambulatory Visit: Payer: Medicare Other | Admitting: Podiatry

## 2022-07-13 DIAGNOSIS — Z1152 Encounter for screening for COVID-19: Secondary | ICD-10-CM | POA: Diagnosis not present

## 2022-07-13 DIAGNOSIS — Z20822 Contact with and (suspected) exposure to covid-19: Secondary | ICD-10-CM | POA: Diagnosis not present

## 2022-07-22 DIAGNOSIS — M4310 Spondylolisthesis, site unspecified: Secondary | ICD-10-CM | POA: Diagnosis not present

## 2022-07-22 DIAGNOSIS — M25512 Pain in left shoulder: Secondary | ICD-10-CM | POA: Diagnosis not present

## 2022-07-22 DIAGNOSIS — M48062 Spinal stenosis, lumbar region with neurogenic claudication: Secondary | ICD-10-CM | POA: Diagnosis not present

## 2022-07-22 DIAGNOSIS — M5136 Other intervertebral disc degeneration, lumbar region: Secondary | ICD-10-CM | POA: Diagnosis not present

## 2022-07-22 DIAGNOSIS — M545 Low back pain, unspecified: Secondary | ICD-10-CM | POA: Diagnosis not present

## 2022-07-22 DIAGNOSIS — M5416 Radiculopathy, lumbar region: Secondary | ICD-10-CM | POA: Diagnosis not present

## 2022-07-28 DIAGNOSIS — H43812 Vitreous degeneration, left eye: Secondary | ICD-10-CM | POA: Diagnosis not present

## 2022-08-16 DIAGNOSIS — E119 Type 2 diabetes mellitus without complications: Secondary | ICD-10-CM | POA: Diagnosis not present

## 2022-08-16 DIAGNOSIS — E782 Mixed hyperlipidemia: Secondary | ICD-10-CM | POA: Diagnosis not present

## 2022-08-16 DIAGNOSIS — I10 Essential (primary) hypertension: Secondary | ICD-10-CM | POA: Diagnosis not present

## 2022-08-18 DIAGNOSIS — M130 Polyarthritis, unspecified: Secondary | ICD-10-CM | POA: Diagnosis not present

## 2022-08-18 DIAGNOSIS — I10 Essential (primary) hypertension: Secondary | ICD-10-CM | POA: Diagnosis not present

## 2022-08-18 DIAGNOSIS — E782 Mixed hyperlipidemia: Secondary | ICD-10-CM | POA: Diagnosis not present

## 2022-08-18 DIAGNOSIS — E1165 Type 2 diabetes mellitus with hyperglycemia: Secondary | ICD-10-CM | POA: Diagnosis not present

## 2022-08-23 ENCOUNTER — Encounter: Payer: Self-pay | Admitting: Urology

## 2022-08-23 ENCOUNTER — Ambulatory Visit (INDEPENDENT_AMBULATORY_CARE_PROVIDER_SITE_OTHER): Payer: Medicare Other | Admitting: Urology

## 2022-08-23 VITALS — BP 133/82 | HR 89 | Ht 63.0 in | Wt 118.0 lb

## 2022-08-23 DIAGNOSIS — N5231 Erectile dysfunction following radical prostatectomy: Secondary | ICD-10-CM

## 2022-08-23 MED ORDER — AMBULATORY NON FORMULARY MEDICATION: 0 refills | Status: DC
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
## 2022-08-25 ENCOUNTER — Encounter: Payer: Self-pay | Admitting: Urology

## 2022-09-07 ENCOUNTER — Other Ambulatory Visit: Payer: Self-pay

## 2022-09-07 DIAGNOSIS — Z1152 Encounter for screening for COVID-19: Secondary | ICD-10-CM | POA: Diagnosis not present

## 2022-09-07 DIAGNOSIS — E785 Hyperlipidemia, unspecified: Secondary | ICD-10-CM

## 2022-09-07 DIAGNOSIS — Z20822 Contact with and (suspected) exposure to covid-19: Secondary | ICD-10-CM | POA: Diagnosis not present

## 2022-09-07 MED ORDER — VASCEPA 1 G PO CAPS: 2.0000 g | ORAL_CAPSULE | Freq: Two times a day (BID) | ORAL | 3 refills | Status: DC

## 2022-09-16 ENCOUNTER — Other Ambulatory Visit: Payer: Self-pay

## 2022-09-16 DIAGNOSIS — E785 Hyperlipidemia, unspecified: Secondary | ICD-10-CM

## 2022-09-16 MED ORDER — VASCEPA 1 G PO CAPS: 2.0000 g | ORAL_CAPSULE | Freq: Two times a day (BID) | ORAL | 3 refills | Status: DC

## 2022-09-17 DIAGNOSIS — M5416 Radiculopathy, lumbar region: Secondary | ICD-10-CM | POA: Diagnosis not present

## 2022-10-04 DIAGNOSIS — M76822 Posterior tibial tendinitis, left leg: Secondary | ICD-10-CM | POA: Diagnosis not present

## 2022-10-04 DIAGNOSIS — Z1152 Encounter for screening for COVID-19: Secondary | ICD-10-CM | POA: Diagnosis not present

## 2022-10-04 DIAGNOSIS — Z20822 Contact with and (suspected) exposure to covid-19: Secondary | ICD-10-CM | POA: Diagnosis not present

## 2022-10-04 DIAGNOSIS — B353 Tinea pedis: Secondary | ICD-10-CM | POA: Diagnosis not present

## 2022-10-05 ENCOUNTER — Other Ambulatory Visit: Payer: Self-pay | Admitting: Family

## 2022-10-05 DIAGNOSIS — R519 Headache, unspecified: Secondary | ICD-10-CM

## 2022-10-05 DIAGNOSIS — Z23 Encounter for immunization: Secondary | ICD-10-CM | POA: Diagnosis not present

## 2022-11-04 DIAGNOSIS — Z20822 Contact with and (suspected) exposure to covid-19: Secondary | ICD-10-CM | POA: Diagnosis not present

## 2022-11-04 DIAGNOSIS — Z1152 Encounter for screening for COVID-19: Secondary | ICD-10-CM | POA: Diagnosis not present

## 2022-11-08 ENCOUNTER — Other Ambulatory Visit: Payer: Self-pay | Admitting: Family

## 2022-11-08 DIAGNOSIS — G4700 Insomnia, unspecified: Secondary | ICD-10-CM

## 2022-11-29 ENCOUNTER — Encounter: Payer: Self-pay | Admitting: Family

## 2022-11-29 ENCOUNTER — Ambulatory Visit (INDEPENDENT_AMBULATORY_CARE_PROVIDER_SITE_OTHER): Payer: Medicare Other | Admitting: Family

## 2022-11-29 VITALS — BP 112/70 | HR 96 | Ht 63.0 in | Wt 127.0 lb

## 2022-11-29 DIAGNOSIS — H6992 Unspecified Eustachian tube disorder, left ear: Secondary | ICD-10-CM

## 2022-11-29 DIAGNOSIS — J301 Allergic rhinitis due to pollen: Secondary | ICD-10-CM

## 2022-11-29 MED ORDER — PREDNISONE 20 MG PO TABS
20.0000 mg | ORAL_TABLET | Freq: Every day | ORAL | 0 refills | Status: DC
Start: 2022-11-29 — End: 2022-12-16

## 2022-11-29 MED ORDER — FLUTICASONE PROPIONATE 50 MCG/ACT NA SUSP
1.0000 | Freq: Every day | NASAL | 2 refills | Status: DC
Start: 2022-11-29 — End: 2023-02-20

## 2022-11-29 MED ORDER — FEXOFENADINE HCL 180 MG PO TABS
180.0000 mg | ORAL_TABLET | Freq: Every day | ORAL | 1 refills | Status: DC
Start: 2022-11-29 — End: 2023-06-28
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
## 2022-12-05 DIAGNOSIS — Z1152 Encounter for screening for COVID-19: Secondary | ICD-10-CM | POA: Diagnosis not present

## 2022-12-05 DIAGNOSIS — Z20822 Contact with and (suspected) exposure to covid-19: Secondary | ICD-10-CM | POA: Diagnosis not present

## 2022-12-07 ENCOUNTER — Other Ambulatory Visit: Payer: Self-pay | Admitting: Family

## 2022-12-07 DIAGNOSIS — E782 Mixed hyperlipidemia: Secondary | ICD-10-CM

## 2022-12-07 DIAGNOSIS — R7303 Prediabetes: Secondary | ICD-10-CM

## 2022-12-07 DIAGNOSIS — I10 Essential (primary) hypertension: Secondary | ICD-10-CM

## 2022-12-08 ENCOUNTER — Encounter: Payer: Self-pay | Admitting: Urology

## 2022-12-09 ENCOUNTER — Other Ambulatory Visit: Payer: Self-pay | Admitting: Family

## 2022-12-09 ENCOUNTER — Other Ambulatory Visit: Payer: Medicare Other

## 2022-12-09 DIAGNOSIS — E782 Mixed hyperlipidemia: Secondary | ICD-10-CM

## 2022-12-09 DIAGNOSIS — I10 Essential (primary) hypertension: Secondary | ICD-10-CM | POA: Diagnosis not present

## 2022-12-09 DIAGNOSIS — R7303 Prediabetes: Secondary | ICD-10-CM | POA: Diagnosis not present

## 2022-12-10 LAB — CBC WITH DIFFERENTIAL
Basophils Absolute: 0 10*3/uL (ref 0.0–0.2)
Basos: 1 %
EOS (ABSOLUTE): 0.2 10*3/uL (ref 0.0–0.4)
Eos: 2 %
Hematocrit: 47.5 % (ref 37.5–51.0)
Hemoglobin: 16.5 g/dL (ref 13.0–17.7)
Immature Grans (Abs): 0 10*3/uL (ref 0.0–0.1)
Immature Granulocytes: 0 %
Lymphocytes Absolute: 1.7 10*3/uL (ref 0.7–3.1)
Lymphs: 22 %
MCH: 33.8 pg — ABNORMAL HIGH (ref 26.6–33.0)
MCHC: 34.7 g/dL (ref 31.5–35.7)
MCV: 97 fL (ref 79–97)
Monocytes Absolute: 0.6 10*3/uL (ref 0.1–0.9)
Monocytes: 8 %
Neutrophils Absolute: 5.4 10*3/uL (ref 1.4–7.0)
Neutrophils: 67 %
RBC: 4.88 x10E6/uL (ref 4.14–5.80)
RDW: 12.5 % (ref 11.6–15.4)
WBC: 8 10*3/uL (ref 3.4–10.8)

## 2022-12-10 LAB — CMP14+EGFR
ALT: 23 IU/L (ref 0–44)
AST: 17 IU/L (ref 0–40)
Albumin/Globulin Ratio: 2.2 (ref 1.2–2.2)
Albumin: 4.4 g/dL (ref 3.7–4.7)
Alkaline Phosphatase: 93 IU/L (ref 44–121)
BUN/Creatinine Ratio: 27 — ABNORMAL HIGH (ref 10–24)
BUN: 20 mg/dL (ref 8–27)
Bilirubin Total: 0.4 mg/dL (ref 0.0–1.2)
CO2: 25 mmol/L (ref 20–29)
Calcium: 9.3 mg/dL (ref 8.6–10.2)
Chloride: 100 mmol/L (ref 96–106)
Creatinine, Ser: 0.74 mg/dL — ABNORMAL LOW (ref 0.76–1.27)
Globulin, Total: 2 g/dL (ref 1.5–4.5)
Glucose: 117 mg/dL — ABNORMAL HIGH (ref 70–99)
Potassium: 4.4 mmol/L (ref 3.5–5.2)
Sodium: 140 mmol/L (ref 134–144)
Total Protein: 6.4 g/dL (ref 6.0–8.5)
eGFR: 90 mL/min/{1.73_m2} (ref >59)

## 2022-12-10 LAB — LIPID PANEL
Chol/HDL Ratio: 3.1 ratio (ref 0.0–5.0)
Cholesterol, Total: 150 mg/dL (ref 100–199)
HDL: 49 mg/dL (ref >39)
LDL Chol Calc (NIH): 77 mg/dL (ref 0–99)
Triglycerides: 140 mg/dL (ref 0–149)
VLDL Cholesterol Cal: 24 mg/dL (ref 5–40)

## 2022-12-10 LAB — HEMOGLOBIN A1C
Est. average glucose Bld gHb Est-mCnc: 146 mg/dL
Hgb A1c MFr Bld: 6.7 % — ABNORMAL HIGH (ref 4.8–5.6)

## 2022-12-16 ENCOUNTER — Encounter: Payer: Self-pay | Admitting: Family

## 2022-12-16 ENCOUNTER — Ambulatory Visit (INDEPENDENT_AMBULATORY_CARE_PROVIDER_SITE_OTHER): Payer: Medicare Other | Admitting: Family

## 2022-12-16 VITALS — BP 140/80 | HR 91 | Ht 63.0 in | Wt 125.4 lb

## 2022-12-16 DIAGNOSIS — I152 Hypertension secondary to endocrine disorders: Secondary | ICD-10-CM

## 2022-12-16 DIAGNOSIS — G8929 Other chronic pain: Secondary | ICD-10-CM

## 2022-12-16 DIAGNOSIS — E1165 Type 2 diabetes mellitus with hyperglycemia: Secondary | ICD-10-CM | POA: Diagnosis not present

## 2022-12-16 DIAGNOSIS — E7800 Pure hypercholesterolemia, unspecified: Secondary | ICD-10-CM

## 2022-12-16 DIAGNOSIS — R519 Headache, unspecified: Secondary | ICD-10-CM | POA: Diagnosis not present

## 2022-12-16 DIAGNOSIS — B356 Tinea cruris: Secondary | ICD-10-CM

## 2022-12-16 MED ORDER — HYDRALAZINE HCL 50 MG PO TABS: 50.0000 mg | ORAL_TABLET | Freq: Three times a day (TID) | ORAL | 3 refills | Status: DC

## 2022-12-16 MED ORDER — EMPAGLIFLOZIN 25 MG PO TABS: 25.0000 mg | ORAL_TABLET | Freq: Every day | ORAL | 1 refills | Status: DC

## 2022-12-16 NOTE — Assessment & Plan Note (Signed)
Given that patient has had persistent rash, will change from [place] to [place].  I am also sending an RX for fluconazole tablets for him to take, and have suggested that he try terbinafine cream OTC.   Will let me know if this does not resolve.  Otherwise, continue current treatment, as he has been well controlled.   Reassess at follow up.
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
## 2022-12-16 NOTE — Assessment & Plan Note (Signed)
Patient is well controlled on current regimen.   He has also expressed that he has been making dietary changes, as his wife has had high cholesterol recently as well.   Continue current therapy for lipid control. Will modify as needed based on labwork results.

## 2022-12-16 NOTE — Assessment & Plan Note (Signed)
Adding a 3rd dose of Hydralazine daily.  He was previously taking it only twice daily.  He will continue to monitor his blood pressure at home and let me know how it is doing.

## 2022-12-16 NOTE — Assessment & Plan Note (Signed)
Sending refills for patient's headache medications

## 2022-12-28 DIAGNOSIS — L281 Prurigo nodularis: Secondary | ICD-10-CM | POA: Diagnosis not present

## 2022-12-28 DIAGNOSIS — R21 Rash and other nonspecific skin eruption: Secondary | ICD-10-CM | POA: Diagnosis not present

## 2022-12-29 DIAGNOSIS — M5416 Radiculopathy, lumbar region: Secondary | ICD-10-CM | POA: Diagnosis not present

## 2023-01-03 DIAGNOSIS — Z20822 Contact with and (suspected) exposure to covid-19: Secondary | ICD-10-CM | POA: Diagnosis not present

## 2023-01-03 DIAGNOSIS — Z1152 Encounter for screening for COVID-19: Secondary | ICD-10-CM | POA: Diagnosis not present

## 2023-01-14 DIAGNOSIS — S73191A Other sprain of right hip, initial encounter: Secondary | ICD-10-CM | POA: Diagnosis not present

## 2023-01-14 DIAGNOSIS — M1612 Unilateral primary osteoarthritis, left hip: Secondary | ICD-10-CM | POA: Diagnosis not present

## 2023-01-14 DIAGNOSIS — S7001XA Contusion of right hip, initial encounter: Secondary | ICD-10-CM | POA: Diagnosis not present

## 2023-01-19 DIAGNOSIS — M7061 Trochanteric bursitis, right hip: Secondary | ICD-10-CM | POA: Diagnosis not present

## 2023-01-19 DIAGNOSIS — S73101A Unspecified sprain of right hip, initial encounter: Secondary | ICD-10-CM | POA: Diagnosis not present

## 2023-01-19 DIAGNOSIS — S7001XA Contusion of right hip, initial encounter: Secondary | ICD-10-CM | POA: Diagnosis not present

## 2023-02-05 ENCOUNTER — Other Ambulatory Visit: Payer: Self-pay | Admitting: Family

## 2023-02-05 DIAGNOSIS — E785 Hyperlipidemia, unspecified: Secondary | ICD-10-CM

## 2023-02-06 ENCOUNTER — Other Ambulatory Visit: Payer: Self-pay | Admitting: Family

## 2023-02-06 DIAGNOSIS — G4700 Insomnia, unspecified: Secondary | ICD-10-CM

## 2023-02-09 ENCOUNTER — Other Ambulatory Visit: Payer: Self-pay | Admitting: Family

## 2023-02-10 ENCOUNTER — Other Ambulatory Visit: Payer: Self-pay

## 2023-02-16 ENCOUNTER — Other Ambulatory Visit: Payer: Self-pay | Admitting: Family

## 2023-02-19 ENCOUNTER — Other Ambulatory Visit: Payer: Self-pay | Admitting: Family

## 2023-02-19 DIAGNOSIS — H6992 Unspecified Eustachian tube disorder, left ear: Secondary | ICD-10-CM

## 2023-02-19 DIAGNOSIS — J301 Allergic rhinitis due to pollen: Secondary | ICD-10-CM

## 2023-02-21 ENCOUNTER — Other Ambulatory Visit: Payer: Self-pay | Admitting: Family

## 2023-02-28 DIAGNOSIS — S73101A Unspecified sprain of right hip, initial encounter: Secondary | ICD-10-CM | POA: Diagnosis not present

## 2023-02-28 DIAGNOSIS — M545 Low back pain, unspecified: Secondary | ICD-10-CM | POA: Diagnosis not present

## 2023-02-28 DIAGNOSIS — S7001XA Contusion of right hip, initial encounter: Secondary | ICD-10-CM | POA: Diagnosis not present

## 2023-02-28 DIAGNOSIS — M7061 Trochanteric bursitis, right hip: Secondary | ICD-10-CM | POA: Diagnosis not present

## 2023-03-16 ENCOUNTER — Ambulatory Visit (INDEPENDENT_AMBULATORY_CARE_PROVIDER_SITE_OTHER): Payer: Medicare Other | Admitting: Podiatry

## 2023-03-16 ENCOUNTER — Encounter: Payer: Self-pay | Admitting: Podiatry

## 2023-03-16 DIAGNOSIS — M2170 Unequal limb length (acquired), unspecified site: Secondary | ICD-10-CM

## 2023-03-16 NOTE — Progress Notes (Signed)
He presents today after having not seen him since November of last year with a chief concern of ankle pain knee pain and hip pain.  He states that he fell on his knee injuring his his right hip.  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  Assessment: Pain in limb secondary to deformity of foot Planter fasciitis bursitis and arthritis.  Plan: Discussed etiology pathology and surgical therapies at this point I feel getting him in with [facility] to have a custom built orthotic made would probably be best for him at this point.  We will follow-up with him in the near future.  Due to the failure of our equipment in [place] he will be traveling to [place] for his scanning.

## 2023-03-21 ENCOUNTER — Ambulatory Visit (INDEPENDENT_AMBULATORY_CARE_PROVIDER_SITE_OTHER): Payer: Medicare Other

## 2023-03-21 DIAGNOSIS — D2372 Other benign neoplasm of skin of left lower limb, including hip: Secondary | ICD-10-CM

## 2023-03-21 DIAGNOSIS — M7752 Other enthesopathy of left foot: Secondary | ICD-10-CM

## 2023-03-21 DIAGNOSIS — M2170 Unequal limb length (acquired), unspecified site: Secondary | ICD-10-CM

## 2023-03-21 NOTE — Progress Notes (Signed)
Patient was present and evaluated for [facility] orthotics and possible heel lift for Left LLD  Patient has Hx of ankle fusion and with this all symptoms indicate left LE shorter than Right  I measured patient with out shoes on upright and foound approx 1/2" LLD left shorter than right  I made him a heel lift to try in shoe he ambulated with out incident with the lift in shoe and states it feels good. I explained the break in and chance that body could get sore if he does not gradually get use to it. If for any reason it causes more pain to remove and discontinue, we can adjust and make shorter.  Patient was also scanned for CFO's today I am adding lift as well to Left foot  Patient will benefit from CFO's as they will help provide total contact to MLA's helping to better distribute body weight across BIL feet greater reducing plantar pressure and pain and to also encourage FF and RF alignment  Patient was scanned items to be ordered and fit when in   [employer], CFo, CFm

## 2023-03-25 DIAGNOSIS — M5416 Radiculopathy, lumbar region: Secondary | ICD-10-CM | POA: Diagnosis not present

## 2023-03-25 DIAGNOSIS — E119 Type 2 diabetes mellitus without complications: Secondary | ICD-10-CM | POA: Diagnosis not present

## 2023-03-29 ENCOUNTER — Other Ambulatory Visit: Payer: Self-pay | Admitting: Family

## 2023-04-08 ENCOUNTER — Encounter: Payer: Self-pay | Admitting: Urology

## 2023-04-11 ENCOUNTER — Other Ambulatory Visit: Payer: Self-pay | Admitting: *Deleted

## 2023-04-11 ENCOUNTER — Telehealth: Payer: Self-pay | Admitting: Family

## 2023-04-11 MED ORDER — AMBULATORY NON FORMULARY MEDICATION: 0 refills | Status: DC

## 2023-04-11 NOTE — Telephone Encounter (Signed)
Patient left VM about requesting his labs to be released. I am unsure if he is needing lab orders or wanting results. We need to call patient and see exactly what he is needing.

## 2023-04-12 ENCOUNTER — Other Ambulatory Visit: Payer: Self-pay

## 2023-04-12 ENCOUNTER — Encounter: Payer: Self-pay | Admitting: *Deleted

## 2023-04-12 DIAGNOSIS — I10 Essential (primary) hypertension: Secondary | ICD-10-CM

## 2023-04-12 DIAGNOSIS — E782 Mixed hyperlipidemia: Secondary | ICD-10-CM

## 2023-04-12 DIAGNOSIS — R7303 Prediabetes: Secondary | ICD-10-CM

## 2023-04-13 DIAGNOSIS — Z23 Encounter for immunization: Secondary | ICD-10-CM | POA: Diagnosis not present

## 2023-04-14 ENCOUNTER — Other Ambulatory Visit: Payer: Medicare Other

## 2023-04-14 DIAGNOSIS — E782 Mixed hyperlipidemia: Secondary | ICD-10-CM | POA: Diagnosis not present

## 2023-04-14 DIAGNOSIS — R7303 Prediabetes: Secondary | ICD-10-CM | POA: Diagnosis not present

## 2023-04-14 DIAGNOSIS — I10 Essential (primary) hypertension: Secondary | ICD-10-CM | POA: Diagnosis not present

## 2023-04-14 DIAGNOSIS — R799 Abnormal finding of blood chemistry, unspecified: Secondary | ICD-10-CM | POA: Diagnosis not present

## 2023-04-15 LAB — CBC WITH DIFFERENTIAL/PLATELET
Basophils Absolute: 0 10*3/uL (ref 0.0–0.2)
Basos: 1 %
EOS (ABSOLUTE): 0.1 10*3/uL (ref 0.0–0.4)
Eos: 2 %
Hematocrit: 47.6 % (ref 37.5–51.0)
Hemoglobin: 16.1 g/dL (ref 13.0–17.7)
Immature Grans (Abs): 0 10*3/uL (ref 0.0–0.1)
Immature Granulocytes: 0 %
Lymphocytes Absolute: 0.9 10*3/uL (ref 0.7–3.1)
Lymphs: 12 %
MCH: 34.4 pg — ABNORMAL HIGH (ref 26.6–33.0)
MCHC: 33.8 g/dL (ref 31.5–35.7)
MCV: 102 fL — ABNORMAL HIGH (ref 79–97)
Monocytes Absolute: 0.4 10*3/uL (ref 0.1–0.9)
Monocytes: 5 %
Neutrophils Absolute: 6 10*3/uL (ref 1.4–7.0)
Neutrophils: 80 %
Platelets: 178 10*3/uL (ref 150–450)
RBC: 4.68 x10E6/uL (ref 4.14–5.80)
RDW: 12.5 % (ref 11.6–15.4)
WBC: 7.5 10*3/uL (ref 3.4–10.8)

## 2023-04-15 LAB — LIPID PANEL
Chol/HDL Ratio: 3.2 ratio (ref 0.0–5.0)
Cholesterol, Total: 155 mg/dL (ref 100–199)
HDL: 49 mg/dL (ref >39)
LDL Chol Calc (NIH): 88 mg/dL (ref 0–99)
Triglycerides: 99 mg/dL (ref 0–149)
VLDL Cholesterol Cal: 18 mg/dL (ref 5–40)

## 2023-04-15 LAB — CMP14+EGFR
ALT: 25 IU/L (ref 0–44)
AST: 16 IU/L (ref 0–40)
Albumin: 4.2 g/dL (ref 3.7–4.7)
Alkaline Phosphatase: 104 IU/L (ref 44–121)
BUN/Creatinine Ratio: 29 — ABNORMAL HIGH (ref 10–24)
BUN: 24 mg/dL (ref 8–27)
Bilirubin Total: 0.5 mg/dL (ref 0.0–1.2)
CO2: 25 mmol/L (ref 20–29)
Calcium: 9.1 mg/dL (ref 8.6–10.2)
Chloride: 103 mmol/L (ref 96–106)
Creatinine, Ser: 0.82 mg/dL (ref 0.76–1.27)
Globulin, Total: 2 g/dL (ref 1.5–4.5)
Glucose: 118 mg/dL — ABNORMAL HIGH (ref 70–99)
Potassium: 4.6 mmol/L (ref 3.5–5.2)
Sodium: 143 mmol/L (ref 134–144)
Total Protein: 6.2 g/dL (ref 6.0–8.5)
eGFR: 87 mL/min/{1.73_m2} (ref >59)

## 2023-04-15 LAB — HEMOGLOBIN A1C
Est. average glucose Bld gHb Est-mCnc: 154 mg/dL
Hgb A1c MFr Bld: 7 % — ABNORMAL HIGH (ref 4.8–5.6)

## 2023-04-17 DIAGNOSIS — Z23 Encounter for immunization: Secondary | ICD-10-CM | POA: Diagnosis not present

## 2023-04-18 ENCOUNTER — Ambulatory Visit: Payer: Medicare Other | Admitting: Family

## 2023-04-20 ENCOUNTER — Ambulatory Visit (INDEPENDENT_AMBULATORY_CARE_PROVIDER_SITE_OTHER): Payer: Medicare Other | Admitting: Family

## 2023-04-20 ENCOUNTER — Encounter: Payer: Self-pay | Admitting: Family

## 2023-04-20 VITALS — BP 120/79 | HR 54 | Ht 63.0 in | Wt 125.2 lb

## 2023-04-20 DIAGNOSIS — I10 Essential (primary) hypertension: Secondary | ICD-10-CM | POA: Diagnosis not present

## 2023-04-20 DIAGNOSIS — D7589 Other specified diseases of blood and blood-forming organs: Secondary | ICD-10-CM

## 2023-04-20 DIAGNOSIS — E1165 Type 2 diabetes mellitus with hyperglycemia: Secondary | ICD-10-CM | POA: Diagnosis not present

## 2023-04-20 DIAGNOSIS — E7800 Pure hypercholesterolemia, unspecified: Secondary | ICD-10-CM

## 2023-04-20 MED ORDER — ZOLPIDEM TARTRATE 5 MG PO TABS: 5.0000 mg | ORAL_TABLET | Freq: Every evening | ORAL | 0 refills | Status: DC | PRN

## 2023-04-20 NOTE — Assessment & Plan Note (Signed)
Checking labs today. Will call pt. With results  Continue current diabetes POC, as patient has been well controlled on current regimen.  Will adjust meds if needed based on labs.

## 2023-04-20 NOTE — Assessment & Plan Note (Signed)
Blood pressure well controlled with current medications.  Continue current therapy.  Will reassess at follow up.
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
## 2023-04-20 NOTE — Assessment & Plan Note (Signed)
Checking labs today.  Continue current therapy for lipid control. Will modify as needed based on labwork results.  

## 2023-04-21 LAB — SPECIMEN STATUS REPORT

## 2023-04-21 LAB — VITAMIN B12: Vitamin B-12: 513 pg/mL (ref 232–1245)

## 2023-04-22 ENCOUNTER — Telehealth: Payer: Self-pay | Admitting: Podiatry

## 2023-04-22 NOTE — Telephone Encounter (Signed)
Pt called inquiring about ankle braces and orthotics ordered on 8/19.

## 2023-04-25 NOTE — Telephone Encounter (Signed)
We did try to call patient on 9.3.24 someone answered and then hung up  Called patient he will be in 3:45 wends 9.25

## 2023-04-26 ENCOUNTER — Telehealth: Payer: Self-pay | Admitting: Podiatry

## 2023-04-26 NOTE — Telephone Encounter (Signed)
Pt called and was asking to be seen this week because he is going on vacation next week for 2 weeks. He said that it looks like something going on with ankle.looks like blood around bottom of foot/ankle. I offered him an appt tomorrow 9.25 in [place] and Thursday in [place] but he could not come to either. He said he would have to wait to schedule until he comes back from out of town.

## 2023-04-27 ENCOUNTER — Ambulatory Visit: Payer: Medicare Other

## 2023-04-27 NOTE — Progress Notes (Signed)
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 Al Corpus .   Orthotics were dispensed and fit was satisfactory. Reviewed instructions for break-in and wear. Written instructions given to patient.  Patient will follow up as needed.  Addison Bailey Cped, CFo, CFm

## 2023-04-27 NOTE — Progress Notes (Addendum)
   06/20/2023 12:03 PM  Seth Baxter September 21, 1940 [id number]   Referring provider: Miki Kins, FNP [address] [place],  [place] [address]  Urological history: 1.  Prostate cancer -RRP (1997)  -Undetectable PSA greater than 20 years postop  2. OAB -Contributing factors of age, prostate cancer, prostatectomy, hypertension, diabetes, lumbar radiculopathy and history of smoking -trospium 20 mg BID  3. ED -Contributing factors of age, prostate cancer, prostatectomy, hypertension, diabetes, lumbar radiculopathy, hyperlipidemia and history of smoking -failed sildenafil   Chief Complaint  Patient presents with   Follow-up   HPI: Seth Baxter is a 82 y.o. male who presents today for ICI titration.    The procedure is discussed with patient.  He is allowed to ask questions.  Questions were answered to his satisfaction.  We were able to proceed to the titration.  Physical Exam:  BP 139/84   Pulse 93   Constitutional:  Well nourished. Alert and oriented, No acute distress. GU: No CVA tenderness.  No bladder fullness or masses.  Patient with uncircumcised phallus. Foreskin easily retracted  Urethral meatus is patent.  No penile discharge. No penile lesions or rashes. Scrotum without lesions, cysts, rashes and/or edema.   Psychiatric: Normal mood and affect.   Procedure  Patient's left corpus cavernosum is identified.  An area near the base of the penis is cleansed with rubbing alcohol.  Careful to avoid the dorsal vein, 0.2 of prostaglandin E1 20 mcg, Lot # [id number]$  exp # 05/27/2023 is injected at a 90 degree angle into the left corpus cavernosum near the base of the penis.  Patient experienced penile fullness in 15 minutes.    Patient's right corpus cavernosum is identified.  An area near the base of the penis is cleansed with rubbing alcohol.  Careful to avoid the dorsal vein, 0.2 of prostaglandin E1 20 mcg, Lot # [id number]$  exp # 05/27/2023 is injected at a 90 degree  angle into the right corpus cavernosum near the base of the penis.  Patient experienced a soft erection in 15 minutes.     Assessment & Plan:    1.  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 dose appropriately -He is comfortable injecting himself, I suggested that he inject himself on Monday morning with 0.4 of the prostaglandin to see if he can get a more satisfactory erection -If he does not achieve a satisfactory erection with a 0.4, I recommend that he wait a day and either on Wednesday or Thursday morning inject 0.6 -I explained the reason I want him to inject in the morning during the week so that if he experiences priapism, we may address it quickly in the office rather than him seeking treatment in the ED -He will contact me with those results -Advised patient of the condition of priapism, painful erection lasting for more than four hours, and to contact the office or seek treatment in the ED immediately    Return for Patient to call back with prostaglandin dose.  Michiel Cowboy, PA-C   [facility] [address] [place], [place] [address] [phone]

## 2023-04-28 ENCOUNTER — Ambulatory Visit (INDEPENDENT_AMBULATORY_CARE_PROVIDER_SITE_OTHER): Payer: Medicare Other | Admitting: Urology

## 2023-04-28 ENCOUNTER — Encounter: Payer: Self-pay | Admitting: Urology

## 2023-04-28 VITALS — BP 139/84 | HR 93

## 2023-04-28 DIAGNOSIS — N5231 Erectile dysfunction following radical prostatectomy: Secondary | ICD-10-CM

## 2023-04-28 NOTE — Patient Instructions (Signed)
TRIMIX SELF-INJECTION INSTRUCTIONS   Start with 0.4 and then if unsatisfactory wait a day and inject 0.6   DETAILED PROCEDURE  1. GETTING SET UP  A. Proper hygiene is important. Wash your hands and keep the penis clean.  B. Assemble the following:  - Bottle of Trimix  - Alcohol pad  - Syringe  C. Keep the Trimix cold by returning the bottle to the refrigerator, or by placing the bottle in a cup of ice.   2. PREPARE THE SYRINGE  A. Wipe the rubber top of the vial with an alcohol pad.  B. After removing the cap of the needle, pull the plunger back to the desired dosage, filling this volume with air. Use a new needle and syringe each time.  C. Insert the needle through the rubber top and inject the air into the vial.  D. Turn the vial with needle and syringe inserted upside down. Pull back on the syringe plunger in a slow and steady motion until the desired dosage is achieved.  E. Tap the side of the syringe (1cc tuberculin syringe with a 29 gauge needle) to allow any air bubbles to float towards the needle. Avoid having these air bubbles in the syringe when self-injecting by first injecting out the collected bubbles that may form.  F. Remove the needle from the bottle and replace the protective cap on the needle.    3. SELECT AND PREPARE THE SITE FOR INJECTION  A. The proper location for injection is at the 9-11 and 1-3 o'clock positions, between the base and mid-portion of the penis.(see diagram) Avoid the midline because of potential for injury to the urethra (6 o'clock; for urinary passage) and the penile arteries and nerves (near 12 o'clock). Avoid any visible veins or arteries on the surface.  B. Grasp and pull the head of the penis toward the side of your leg with the index finger and thumb (use the left hand, if right handed). While maintaining light tension, select a site for injection.  C. Clean the site with an alcohol pad.   4: INJECT TRIMIX AND APPLY COMPRESSION  A. With a  steady and continuous motion, penetrate the skin with the needle at a 90 o angle. The needle should then be advanced to the hub. Slight resistance is encountered as the needle passes into the proper position within the erectile tissue (corporeal body).  B. Inject the Trimix over approximately 4 seconds. Withdraw the needle from the penis and apply compression to the injection site for approximately 1 minute. Several minutes of compression may be required to avoid bleeding, especially if you are an aspirin user.  C. Replace the cap on the needle and dispose of properly.   If you experience a painful erection that will not go down, take four (30 mg) tablets of pseudoephedrine (Sudafed-not the extended release) and if the erection does not go down in the next hour or increases in pain, contact the office immediately or seek treatment in the ED

## 2023-05-04 ENCOUNTER — Other Ambulatory Visit: Payer: Self-pay | Admitting: Family

## 2023-05-14 ENCOUNTER — Other Ambulatory Visit: Payer: Self-pay | Admitting: Family

## 2023-05-14 DIAGNOSIS — G4700 Insomnia, unspecified: Secondary | ICD-10-CM

## 2023-05-17 ENCOUNTER — Other Ambulatory Visit: Payer: Self-pay

## 2023-05-17 ENCOUNTER — Encounter: Payer: Self-pay | Admitting: Family

## 2023-05-17 DIAGNOSIS — G4700 Insomnia, unspecified: Secondary | ICD-10-CM

## 2023-05-18 ENCOUNTER — Encounter: Payer: Self-pay | Admitting: Family

## 2023-05-18 MED ORDER — ESZOPICLONE 3 MG PO TABS: ORAL_TABLET | ORAL | 1 refills | Status: DC

## 2023-05-19 ENCOUNTER — Other Ambulatory Visit: Payer: Medicare Other

## 2023-06-03 ENCOUNTER — Other Ambulatory Visit: Payer: Self-pay | Admitting: Family

## 2023-06-08 DIAGNOSIS — M5416 Radiculopathy, lumbar region: Secondary | ICD-10-CM | POA: Diagnosis not present

## 2023-06-08 DIAGNOSIS — E119 Type 2 diabetes mellitus without complications: Secondary | ICD-10-CM | POA: Diagnosis not present

## 2023-06-10 ENCOUNTER — Other Ambulatory Visit: Payer: Medicare Other

## 2023-06-14 ENCOUNTER — Ambulatory Visit: Payer: Medicare Other

## 2023-06-14 NOTE — Progress Notes (Signed)
Patient was present C/O left knee and hip hurting I explained he may not be adjusting to heel lift that well, I reduced heel lift to 1/8"  Also c/o toes being crowded in shoes  I cut inserts to 3/4 length and skived the edge to reduce pressure under toes providing more room in toebox  Patient states he will try and let us know  Seth Baxter Cped, CFo, CFm

## 2023-06-20 ENCOUNTER — Other Ambulatory Visit: Payer: Self-pay | Admitting: Urology

## 2023-06-20 NOTE — Progress Notes (Signed)
[facility] and discussed with pharmacy how to proceed since the prostaglandin E1 20 mcg was not effective.  They recommended changing to the Trimix (30/1/10) dose.  He will return on the 26th for Trimix titration

## 2023-06-23 NOTE — Progress Notes (Signed)
   06/28/2023 9:49 AM  Thomasenia Bottoms 04/11/1941 [id number]   Referring provider: Miki Kins, FNP [address] [place],  [place] [address]  Urological history: 1.  Prostate cancer -RRP (1997)  -Undetectable PSA greater than 20 years postop  2. OAB -Contributing factors of age, prostate cancer, prostatectomy, hypertension, diabetes, lumbar radiculopathy and history of smoking -trospium 20 mg BID  3. ED -Contributing factors of age, prostate cancer, prostatectomy, hypertension, diabetes, lumbar radiculopathy, hyperlipidemia and history of smoking -failed sildenafil   Chief Complaint  Patient presents with   Erectile Dysfunction   HPI: DIETRICH MARAN is a 82 y.o. male who presents today for ICI titration.    The procedure is discussed with patient.  He is allowed to ask questions.  Questions were answered to his satisfaction.  We were able to proceed to the titration.  Physical Exam:  BP 137/83   Pulse 78   Ht 5\' 3"  (1.6 m)   Wt 118 lb (53.5 kg)   BMI 20.90 kg/m   Constitutional:  Well nourished. Alert and oriented, No acute distress. GU: No CVA tenderness.  No bladder fullness or masses.  Patient with uncircumcised phallus. Foreskin easily retracted  Urethral meatus is patent.  No penile discharge. No penile lesions or rashes. Scrotum without lesions, cysts, rashes and/or edema.   Psychiatric: Normal mood and affect.   Procedure  Patient's left corpus cavernosum is identified.  An area near the base of the penis is cleansed with rubbing alcohol.  Careful to avoid the dorsal vein, 0.4 mcg of Trimix (papaverine 30 mg, phentolamine 1 mg and prostaglandin E1 10 mcg, Lot # [id number]VQQVZDG$$LOVFIEPPIRJJOACZ_YSAYTKZ[id number]$  exp # [id number] is injected at a 90 degree angle into the left  corpus cavernosum near the base of the penis.  Patient experienced a semi firm erection in 15 minutes.    Patient's right corpus cavernosum is identified.  An area near the base of the penis is cleansed with rubbing alcohol.   Careful to avoid the dorsal vein, 0.2 mcg of Trimix (papaverine 30 mg, phentolamine 1 mg and prostaglandin E1 10 mcg, Lot # [id number]$$DPOEUMPNTIRWERXV_QM[id number]$  exp # [id number] is injected at a 90 degree angle into the right corpus cavernosum near the base of the penis.  Patient experienced a semi firm erection in 15 minutes.     Assessment & Plan:    1.  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  Michiel Cowboy, PA-C   [facility] [address] [place], [place] [address] [phone]  I spent 15 minutes on the day of the encounter to include pre-visit record review, face-to-face time with the patient, and post-visit ordering of tests.

## 2023-06-28 ENCOUNTER — Ambulatory Visit: Payer: Medicare Other | Admitting: Urology

## 2023-06-28 ENCOUNTER — Encounter: Payer: Self-pay | Admitting: Urology

## 2023-06-28 VITALS — BP 137/83 | HR 78 | Ht 63.0 in | Wt 118.0 lb

## 2023-06-28 DIAGNOSIS — N5231 Erectile dysfunction following radical prostatectomy: Secondary | ICD-10-CM

## 2023-06-29 ENCOUNTER — Encounter: Payer: Self-pay | Admitting: Podiatry

## 2023-07-06 ENCOUNTER — Encounter: Payer: Self-pay | Admitting: Family

## 2023-07-06 ENCOUNTER — Other Ambulatory Visit: Payer: Medicare Other

## 2023-07-06 ENCOUNTER — Other Ambulatory Visit: Payer: Self-pay

## 2023-07-06 MED ORDER — HYDRALAZINE HCL 50 MG PO TABS: 50.0000 mg | ORAL_TABLET | Freq: Three times a day (TID) | ORAL | 3 refills | Status: DC

## 2023-07-07 ENCOUNTER — Ambulatory Visit: Payer: Medicare Other | Admitting: Family

## 2023-07-07 DIAGNOSIS — Z23 Encounter for immunization: Secondary | ICD-10-CM | POA: Diagnosis not present

## 2023-07-10 ENCOUNTER — Encounter: Payer: Self-pay | Admitting: Family

## 2023-07-10 NOTE — Progress Notes (Signed)
   CHIEF COMPLAINT  TDAP Vaccine     REASON FOR VISIT  TDAP Vaccine     ASSESSMENT  TDAP Need    PLAN  Diagnoses and all orders for this visit:  Need for Tdap vaccination -     Tdap vaccine greater than or equal to 83yo IM     Pt. given TDAP vaccine in clinic.  Patient encouraged to manage symptoms with supportive measures as needed after vaccine.   Total time spent: 5 minutes  Miki Kins, FNP  07/07/2023

## 2023-07-15 ENCOUNTER — Other Ambulatory Visit: Payer: Medicare Other

## 2023-07-16 ENCOUNTER — Encounter: Payer: Self-pay | Admitting: Family

## 2023-07-16 MED ORDER — AMOXICILLIN-POT CLAVULANATE 875-125 MG PO TABS: 1.0000 | ORAL_TABLET | Freq: Two times a day (BID) | ORAL | 0 refills | Status: DC

## 2023-08-01 ENCOUNTER — Other Ambulatory Visit: Payer: Self-pay | Admitting: Family

## 2023-08-01 DIAGNOSIS — G4762 Sleep related leg cramps: Secondary | ICD-10-CM

## 2023-08-03 ENCOUNTER — Other Ambulatory Visit: Payer: Self-pay | Admitting: Family

## 2023-08-03 DIAGNOSIS — G8929 Other chronic pain: Secondary | ICD-10-CM

## 2023-08-09 ENCOUNTER — Encounter: Payer: Self-pay | Admitting: Family

## 2023-08-11 ENCOUNTER — Other Ambulatory Visit: Payer: Self-pay

## 2023-08-11 ENCOUNTER — Other Ambulatory Visit: Payer: Self-pay | Admitting: Family

## 2023-08-11 ENCOUNTER — Encounter: Payer: Self-pay | Admitting: Family

## 2023-08-11 DIAGNOSIS — R7303 Prediabetes: Secondary | ICD-10-CM

## 2023-08-11 DIAGNOSIS — G8929 Other chronic pain: Secondary | ICD-10-CM

## 2023-08-11 DIAGNOSIS — I10 Essential (primary) hypertension: Secondary | ICD-10-CM

## 2023-08-11 DIAGNOSIS — E782 Mixed hyperlipidemia: Secondary | ICD-10-CM

## 2023-08-12 ENCOUNTER — Encounter: Payer: Self-pay | Admitting: Family

## 2023-08-15 ENCOUNTER — Other Ambulatory Visit: Payer: Medicare Other

## 2023-08-15 DIAGNOSIS — R7303 Prediabetes: Secondary | ICD-10-CM | POA: Diagnosis not present

## 2023-08-15 DIAGNOSIS — I10 Essential (primary) hypertension: Secondary | ICD-10-CM | POA: Diagnosis not present

## 2023-08-15 DIAGNOSIS — E782 Mixed hyperlipidemia: Secondary | ICD-10-CM | POA: Diagnosis not present

## 2023-08-16 LAB — CMP14+EGFR
ALT: 18 [IU]/L (ref 0–44)
AST: 15 [IU]/L (ref 0–40)
Albumin: 4.2 g/dL (ref 3.7–4.7)
Alkaline Phosphatase: 112 [IU]/L (ref 44–121)
BUN/Creatinine Ratio: 28 — ABNORMAL HIGH (ref 10–24)
BUN: 21 mg/dL (ref 8–27)
Bilirubin Total: 0.4 mg/dL (ref 0.0–1.2)
CO2: 25 mmol/L (ref 20–29)
Calcium: 9.2 mg/dL (ref 8.6–10.2)
Chloride: 103 mmol/L (ref 96–106)
Creatinine, Ser: 0.75 mg/dL — ABNORMAL LOW (ref 0.76–1.27)
Globulin, Total: 2 g/dL (ref 1.5–4.5)
Glucose: 131 mg/dL — ABNORMAL HIGH (ref 70–99)
Potassium: 4.5 mmol/L (ref 3.5–5.2)
Sodium: 144 mmol/L (ref 134–144)
Total Protein: 6.2 g/dL (ref 6.0–8.5)
eGFR: 90 mL/min/{1.73_m2} (ref >59)

## 2023-08-16 LAB — LIPID PANEL
Chol/HDL Ratio: 3.4 {ratio} (ref 0.0–5.0)
Cholesterol, Total: 153 mg/dL (ref 100–199)
HDL: 45 mg/dL (ref >39)
LDL Chol Calc (NIH): 86 mg/dL (ref 0–99)
Triglycerides: 126 mg/dL (ref 0–149)
VLDL Cholesterol Cal: 22 mg/dL (ref 5–40)

## 2023-08-16 LAB — HEMOGLOBIN A1C
Est. average glucose Bld gHb Est-mCnc: 143 mg/dL
Hgb A1c MFr Bld: 6.6 % — ABNORMAL HIGH (ref 4.8–5.6)

## 2023-08-19 ENCOUNTER — Ambulatory Visit (INDEPENDENT_AMBULATORY_CARE_PROVIDER_SITE_OTHER): Payer: Medicare Other | Admitting: Family

## 2023-08-19 ENCOUNTER — Encounter: Payer: Self-pay | Admitting: Family

## 2023-08-19 VITALS — BP 140/84 | HR 69 | Ht 63.0 in | Wt 126.2 lb

## 2023-08-19 DIAGNOSIS — E7800 Pure hypercholesterolemia, unspecified: Secondary | ICD-10-CM | POA: Diagnosis not present

## 2023-08-19 DIAGNOSIS — L299 Pruritus, unspecified: Secondary | ICD-10-CM | POA: Diagnosis not present

## 2023-08-19 DIAGNOSIS — L235 Allergic contact dermatitis due to other chemical products: Secondary | ICD-10-CM | POA: Diagnosis not present

## 2023-08-19 DIAGNOSIS — E1165 Type 2 diabetes mellitus with hyperglycemia: Secondary | ICD-10-CM

## 2023-08-19 DIAGNOSIS — I10 Essential (primary) hypertension: Secondary | ICD-10-CM

## 2023-08-19 LAB — POC CREATINE & ALBUMIN,URINE
Creatinine, POC: 50 mg/dL
Microalbumin Ur, POC: 30 mg/L

## 2023-08-19 MED ORDER — HYDROCHLOROTHIAZIDE 12.5 MG PO TABS: 12.5000 mg | ORAL_TABLET | Freq: Every day | ORAL | 1 refills | Status: DC

## 2023-08-19 NOTE — Assessment & Plan Note (Signed)
Restart hydrochlorothiazide for BP control.  He will let me know if this does not improve BP.   Reassess at follow up.

## 2023-08-19 NOTE — Assessment & Plan Note (Signed)
Checking labs today. Will call pt. With results  Continue current diabetes POC, as patient has been well controlled on current regimen.  Will adjust meds if needed based on labs.  
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
## 2023-08-19 NOTE — Assessment & Plan Note (Signed)
Continue current therapy for lipid control. Will modify as needed based on labwork results.   

## 2023-08-24 DIAGNOSIS — M5416 Radiculopathy, lumbar region: Secondary | ICD-10-CM | POA: Diagnosis not present

## 2023-08-25 DIAGNOSIS — Z961 Presence of intraocular lens: Secondary | ICD-10-CM | POA: Diagnosis not present

## 2023-08-25 DIAGNOSIS — H43813 Vitreous degeneration, bilateral: Secondary | ICD-10-CM | POA: Diagnosis not present

## 2023-09-01 DIAGNOSIS — M1712 Unilateral primary osteoarthritis, left knee: Secondary | ICD-10-CM | POA: Diagnosis not present

## 2023-10-19 ENCOUNTER — Other Ambulatory Visit: Payer: Self-pay | Admitting: Family

## 2023-10-19 DIAGNOSIS — G8929 Other chronic pain: Secondary | ICD-10-CM

## 2023-10-20 DIAGNOSIS — M25512 Pain in left shoulder: Secondary | ICD-10-CM | POA: Diagnosis not present

## 2023-10-20 DIAGNOSIS — M4310 Spondylolisthesis, site unspecified: Secondary | ICD-10-CM | POA: Diagnosis not present

## 2023-10-20 DIAGNOSIS — M48062 Spinal stenosis, lumbar region with neurogenic claudication: Secondary | ICD-10-CM | POA: Diagnosis not present

## 2023-10-20 DIAGNOSIS — M51362 Other intervertebral disc degeneration, lumbar region with discogenic back pain and lower extremity pain: Secondary | ICD-10-CM | POA: Diagnosis not present

## 2023-11-02 ENCOUNTER — Other Ambulatory Visit: Payer: Self-pay | Admitting: Family

## 2023-11-02 DIAGNOSIS — E785 Hyperlipidemia, unspecified: Secondary | ICD-10-CM

## 2023-11-07 ENCOUNTER — Encounter: Payer: Self-pay | Admitting: Family

## 2023-11-07 DIAGNOSIS — G4700 Insomnia, unspecified: Secondary | ICD-10-CM

## 2023-11-09 DIAGNOSIS — M5416 Radiculopathy, lumbar region: Secondary | ICD-10-CM | POA: Diagnosis not present

## 2023-11-09 MED ORDER — ESZOPICLONE 3 MG PO TABS: ORAL_TABLET | ORAL | 1 refills | Status: DC

## 2023-11-25 ENCOUNTER — Encounter: Payer: Self-pay | Admitting: Family

## 2023-11-25 MED ORDER — PAXLOVID (300/100) 20 X 150 MG & 10 X 100MG PO TBPK: 3.0000 | ORAL_TABLET | Freq: Two times a day (BID) | ORAL | 0 refills | Status: DC

## 2023-11-27 ENCOUNTER — Other Ambulatory Visit: Payer: Self-pay | Admitting: Family

## 2023-11-27 DIAGNOSIS — E785 Hyperlipidemia, unspecified: Secondary | ICD-10-CM

## 2023-12-07 ENCOUNTER — Telehealth: Payer: Self-pay | Admitting: Family

## 2023-12-07 NOTE — Telephone Encounter (Signed)
 Patient left VM wanting to get his lab orders put in so he can come have his labs drawn next Monday or Tuesday. Please place orders.

## 2023-12-08 ENCOUNTER — Other Ambulatory Visit: Payer: Self-pay

## 2023-12-08 DIAGNOSIS — E1165 Type 2 diabetes mellitus with hyperglycemia: Secondary | ICD-10-CM

## 2023-12-08 DIAGNOSIS — M25562 Pain in left knee: Secondary | ICD-10-CM | POA: Diagnosis not present

## 2023-12-08 DIAGNOSIS — I10 Essential (primary) hypertension: Secondary | ICD-10-CM

## 2023-12-08 DIAGNOSIS — E7800 Pure hypercholesterolemia, unspecified: Secondary | ICD-10-CM

## 2023-12-08 DIAGNOSIS — M1712 Unilateral primary osteoarthritis, left knee: Secondary | ICD-10-CM | POA: Diagnosis not present

## 2023-12-08 DIAGNOSIS — E782 Mixed hyperlipidemia: Secondary | ICD-10-CM

## 2023-12-12 ENCOUNTER — Other Ambulatory Visit

## 2023-12-12 DIAGNOSIS — E7800 Pure hypercholesterolemia, unspecified: Secondary | ICD-10-CM | POA: Diagnosis not present

## 2023-12-12 DIAGNOSIS — E782 Mixed hyperlipidemia: Secondary | ICD-10-CM | POA: Diagnosis not present

## 2023-12-12 DIAGNOSIS — I10 Essential (primary) hypertension: Secondary | ICD-10-CM | POA: Diagnosis not present

## 2023-12-12 DIAGNOSIS — E1165 Type 2 diabetes mellitus with hyperglycemia: Secondary | ICD-10-CM | POA: Diagnosis not present

## 2023-12-13 DIAGNOSIS — M1712 Unilateral primary osteoarthritis, left knee: Secondary | ICD-10-CM | POA: Diagnosis not present

## 2023-12-14 LAB — CMP14+EGFR
ALT: 30 IU/L (ref 0–44)
AST: 14 IU/L (ref 0–40)
Albumin: 4.2 g/dL (ref 3.7–4.7)
Alkaline Phosphatase: 91 IU/L (ref 44–121)
BUN/Creatinine Ratio: 37 — ABNORMAL HIGH (ref 10–24)
BUN: 26 mg/dL (ref 8–27)
Bilirubin Total: 0.4 mg/dL (ref 0.0–1.2)
CO2: 23 mmol/L (ref 20–29)
Calcium: 9 mg/dL (ref 8.6–10.2)
Chloride: 103 mmol/L (ref 96–106)
Creatinine, Ser: 0.7 mg/dL — ABNORMAL LOW (ref 0.76–1.27)
Globulin, Total: 2.3 g/dL (ref 1.5–4.5)
Glucose: 176 mg/dL — ABNORMAL HIGH (ref 70–99)
Potassium: 4.6 mmol/L (ref 3.5–5.2)
Sodium: 143 mmol/L (ref 134–144)
Total Protein: 6.5 g/dL (ref 6.0–8.5)
eGFR: 91 mL/min/{1.73_m2} (ref >59)

## 2023-12-14 LAB — HEMOGLOBIN A1C
Est. average glucose Bld gHb Est-mCnc: 189 mg/dL
Hgb A1c MFr Bld: 8.2 % — ABNORMAL HIGH (ref 4.8–5.6)

## 2023-12-14 LAB — LIPID PANEL
Chol/HDL Ratio: 3.5 ratio (ref 0.0–5.0)
Cholesterol, Total: 184 mg/dL (ref 100–199)
HDL: 52 mg/dL (ref >39)
LDL Chol Calc (NIH): 105 mg/dL — ABNORMAL HIGH (ref 0–99)
Triglycerides: 156 mg/dL — ABNORMAL HIGH (ref 0–149)
VLDL Cholesterol Cal: 27 mg/dL (ref 5–40)

## 2023-12-14 LAB — TSH: TSH: 2.18 u[IU]/mL (ref 0.450–4.500)

## 2023-12-15 DIAGNOSIS — M25512 Pain in left shoulder: Secondary | ICD-10-CM | POA: Diagnosis not present

## 2023-12-15 DIAGNOSIS — M51369 Other intervertebral disc degeneration, lumbar region without mention of lumbar back pain or lower extremity pain: Secondary | ICD-10-CM | POA: Diagnosis not present

## 2023-12-15 DIAGNOSIS — M5416 Radiculopathy, lumbar region: Secondary | ICD-10-CM | POA: Diagnosis not present

## 2023-12-15 DIAGNOSIS — M48062 Spinal stenosis, lumbar region with neurogenic claudication: Secondary | ICD-10-CM | POA: Diagnosis not present

## 2023-12-15 DIAGNOSIS — M4310 Spondylolisthesis, site unspecified: Secondary | ICD-10-CM | POA: Diagnosis not present

## 2023-12-16 ENCOUNTER — Ambulatory Visit (INDEPENDENT_AMBULATORY_CARE_PROVIDER_SITE_OTHER): Payer: Medicare Other | Admitting: Family

## 2023-12-16 ENCOUNTER — Encounter: Payer: Self-pay | Admitting: Family

## 2023-12-16 VITALS — BP 98/62 | HR 80 | Ht 63.0 in | Wt 122.0 lb

## 2023-12-16 DIAGNOSIS — M1710 Unilateral primary osteoarthritis, unspecified knee: Secondary | ICD-10-CM

## 2023-12-16 DIAGNOSIS — E1165 Type 2 diabetes mellitus with hyperglycemia: Secondary | ICD-10-CM | POA: Diagnosis not present

## 2023-12-16 DIAGNOSIS — G8929 Other chronic pain: Secondary | ICD-10-CM

## 2023-12-16 DIAGNOSIS — R519 Headache, unspecified: Secondary | ICD-10-CM

## 2023-12-16 DIAGNOSIS — M5416 Radiculopathy, lumbar region: Secondary | ICD-10-CM | POA: Diagnosis not present

## 2023-12-16 DIAGNOSIS — I10 Essential (primary) hypertension: Secondary | ICD-10-CM

## 2023-12-16 DIAGNOSIS — E7800 Pure hypercholesterolemia, unspecified: Secondary | ICD-10-CM

## 2023-12-16 MED ORDER — HYDROCHLOROTHIAZIDE 12.5 MG PO TABS: 12.5000 mg | ORAL_TABLET | Freq: Every day | ORAL | 1 refills | Status: DC

## 2023-12-16 MED ORDER — BUTALBITAL-ASPIRIN-CAFFEINE 50-325-40 MG PO CAPS: ORAL_CAPSULE | ORAL | 5 refills | Status: DC

## 2023-12-16 NOTE — Assessment & Plan Note (Signed)
 Blood pressure well controlled with current medications.  Continue current therapy.  Will reassess at follow up.

## 2023-12-16 NOTE — Assessment & Plan Note (Signed)
 Continue current therapy for lipid control. Will modify as needed based on labwork results.

## 2023-12-16 NOTE — Assessment & Plan Note (Signed)
 Patient is seen by Ortho, who manage this condition.  He is well controlled with current therapy.   Will defer to them for further changes to plan of care.

## 2023-12-16 NOTE — Assessment & Plan Note (Signed)
 Patient stable.  Well controlled with current therapy.   Continue current meds.

## 2023-12-16 NOTE — Assessment & Plan Note (Signed)
 Will recheck A1C in 1 week, suspect this might be due to his illness.  If still elevated, will adjust as needed.   Continue current meds for now, will call with results and next steps when available.
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
## 2023-12-22 ENCOUNTER — Encounter: Payer: Self-pay | Admitting: Family

## 2024-01-09 DIAGNOSIS — M25562 Pain in left knee: Secondary | ICD-10-CM | POA: Diagnosis not present

## 2024-01-09 DIAGNOSIS — M1712 Unilateral primary osteoarthritis, left knee: Secondary | ICD-10-CM | POA: Diagnosis not present

## 2024-01-16 DIAGNOSIS — M1712 Unilateral primary osteoarthritis, left knee: Secondary | ICD-10-CM | POA: Diagnosis not present

## 2024-01-24 ENCOUNTER — Other Ambulatory Visit: Payer: Self-pay

## 2024-02-05 ENCOUNTER — Other Ambulatory Visit: Payer: Self-pay | Admitting: Family

## 2024-02-06 ENCOUNTER — Other Ambulatory Visit: Payer: Self-pay | Admitting: Family

## 2024-02-08 DIAGNOSIS — M5416 Radiculopathy, lumbar region: Secondary | ICD-10-CM | POA: Diagnosis not present

## 2024-02-12 ENCOUNTER — Other Ambulatory Visit: Payer: Self-pay | Admitting: Family

## 2024-02-12 DIAGNOSIS — E785 Hyperlipidemia, unspecified: Secondary | ICD-10-CM

## 2024-03-13 ENCOUNTER — Other Ambulatory Visit: Payer: Self-pay | Admitting: Urology

## 2024-03-13 MED ORDER — AMBULATORY NON FORMULARY MEDICATION: 0 refills | Status: AC

## 2024-03-14 ENCOUNTER — Other Ambulatory Visit: Payer: Self-pay | Admitting: Family

## 2024-03-22 ENCOUNTER — Ambulatory Visit: Admitting: Urology

## 2024-03-22 DIAGNOSIS — M1712 Unilateral primary osteoarthritis, left knee: Secondary | ICD-10-CM | POA: Diagnosis not present

## 2024-03-22 DIAGNOSIS — M25562 Pain in left knee: Secondary | ICD-10-CM | POA: Diagnosis not present

## 2024-04-03 DIAGNOSIS — Z23 Encounter for immunization: Secondary | ICD-10-CM | POA: Diagnosis not present

## 2024-04-06 ENCOUNTER — Telehealth: Payer: Self-pay

## 2024-04-06 ENCOUNTER — Other Ambulatory Visit: Payer: Self-pay

## 2024-04-06 DIAGNOSIS — E7800 Pure hypercholesterolemia, unspecified: Secondary | ICD-10-CM

## 2024-04-06 DIAGNOSIS — E1165 Type 2 diabetes mellitus with hyperglycemia: Secondary | ICD-10-CM

## 2024-04-06 DIAGNOSIS — I10 Essential (primary) hypertension: Secondary | ICD-10-CM

## 2024-04-06 NOTE — Telephone Encounter (Signed)
 Patient called about having lab put in for him before his appt, labs have been placed for the patient

## 2024-04-09 DIAGNOSIS — S86812A Strain of other muscle(s) and tendon(s) at lower leg level, left leg, initial encounter: Secondary | ICD-10-CM | POA: Diagnosis not present

## 2024-04-09 DIAGNOSIS — M1712 Unilateral primary osteoarthritis, left knee: Secondary | ICD-10-CM | POA: Diagnosis not present

## 2024-04-10 ENCOUNTER — Other Ambulatory Visit

## 2024-04-10 DIAGNOSIS — E7800 Pure hypercholesterolemia, unspecified: Secondary | ICD-10-CM | POA: Diagnosis not present

## 2024-04-10 DIAGNOSIS — I10 Essential (primary) hypertension: Secondary | ICD-10-CM | POA: Diagnosis not present

## 2024-04-10 DIAGNOSIS — E1165 Type 2 diabetes mellitus with hyperglycemia: Secondary | ICD-10-CM | POA: Diagnosis not present

## 2024-04-11 LAB — CMP14+EGFR
ALT: 35 IU/L (ref 0–44)
AST: 18 IU/L (ref 0–40)
Albumin: 4.1 g/dL (ref 3.7–4.7)
Alkaline Phosphatase: 105 IU/L (ref 44–121)
BUN/Creatinine Ratio: 30 — ABNORMAL HIGH (ref 10–24)
BUN: 21 mg/dL (ref 8–27)
Bilirubin Total: 0.4 mg/dL (ref 0.0–1.2)
CO2: 26 mmol/L (ref 20–29)
Calcium: 9.3 mg/dL (ref 8.6–10.2)
Chloride: 100 mmol/L (ref 96–106)
Creatinine, Ser: 0.7 mg/dL — ABNORMAL LOW (ref 0.76–1.27)
Globulin, Total: 2.2 g/dL (ref 1.5–4.5)
Glucose: 135 mg/dL — ABNORMAL HIGH (ref 70–99)
Potassium: 3.8 mmol/L (ref 3.5–5.2)
Sodium: 140 mmol/L (ref 134–144)
Total Protein: 6.3 g/dL (ref 6.0–8.5)
eGFR: 91 mL/min/1.73 (ref >59)

## 2024-04-11 LAB — LIPID PANEL
Chol/HDL Ratio: 4.2 ratio (ref 0.0–5.0)
Cholesterol, Total: 196 mg/dL (ref 100–199)
HDL: 47 mg/dL (ref >39)
LDL Chol Calc (NIH): 124 mg/dL — ABNORMAL HIGH (ref 0–99)
Triglycerides: 143 mg/dL (ref 0–149)
VLDL Cholesterol Cal: 25 mg/dL (ref 5–40)

## 2024-04-11 LAB — HEMOGLOBIN A1C
Est. average glucose Bld gHb Est-mCnc: 157 mg/dL
Hgb A1c MFr Bld: 7.1 % — ABNORMAL HIGH (ref 4.8–5.6)

## 2024-04-11 LAB — TSH: TSH: 2.4 u[IU]/mL (ref 0.450–4.500)

## 2024-04-12 ENCOUNTER — Ambulatory Visit: Payer: Self-pay

## 2024-04-13 DIAGNOSIS — M7981 Nontraumatic hematoma of soft tissue: Secondary | ICD-10-CM | POA: Diagnosis not present

## 2024-04-13 DIAGNOSIS — M1712 Unilateral primary osteoarthritis, left knee: Secondary | ICD-10-CM | POA: Diagnosis not present

## 2024-04-13 DIAGNOSIS — M79662 Pain in left lower leg: Secondary | ICD-10-CM | POA: Diagnosis not present

## 2024-04-17 ENCOUNTER — Encounter: Payer: Self-pay | Admitting: Family

## 2024-04-17 ENCOUNTER — Ambulatory Visit (INDEPENDENT_AMBULATORY_CARE_PROVIDER_SITE_OTHER): Admitting: Family

## 2024-04-17 VITALS — BP 122/82 | HR 77 | Ht 63.0 in | Wt 124.6 lb

## 2024-04-17 DIAGNOSIS — F5101 Primary insomnia: Secondary | ICD-10-CM

## 2024-04-17 DIAGNOSIS — G4762 Sleep related leg cramps: Secondary | ICD-10-CM | POA: Diagnosis not present

## 2024-04-17 DIAGNOSIS — E1165 Type 2 diabetes mellitus with hyperglycemia: Secondary | ICD-10-CM | POA: Diagnosis not present

## 2024-04-17 DIAGNOSIS — Z23 Encounter for immunization: Secondary | ICD-10-CM | POA: Diagnosis not present

## 2024-04-17 DIAGNOSIS — I10 Essential (primary) hypertension: Secondary | ICD-10-CM | POA: Diagnosis not present

## 2024-04-17 DIAGNOSIS — K219 Gastro-esophageal reflux disease without esophagitis: Secondary | ICD-10-CM

## 2024-04-17 DIAGNOSIS — G8929 Other chronic pain: Secondary | ICD-10-CM | POA: Diagnosis not present

## 2024-04-17 DIAGNOSIS — E7800 Pure hypercholesterolemia, unspecified: Secondary | ICD-10-CM | POA: Diagnosis not present

## 2024-04-17 DIAGNOSIS — R519 Headache, unspecified: Secondary | ICD-10-CM

## 2024-04-17 MED ORDER — FLUCONAZOLE 150 MG PO TABS: 150.0000 mg | ORAL_TABLET | Freq: Every day | ORAL | 0 refills | Status: DC

## 2024-04-17 MED ORDER — CYCLOBENZAPRINE HCL 5 MG PO TABS: 2.5000 mg | ORAL_TABLET | Freq: Every day | ORAL | 2 refills | Status: AC

## 2024-04-17 MED ORDER — FLUCONAZOLE 150 MG PO TABS: 150.0000 mg | ORAL_TABLET | Freq: Every day | ORAL | 0 refills | Status: AC

## 2024-04-17 NOTE — Assessment & Plan Note (Signed)
 Continue current therapy for lipid control. Will modify as needed based on labwork results.

## 2024-04-17 NOTE — Assessment & Plan Note (Signed)
 Blood pressure well controlled with current medications.  Continue current therapy.  Will reassess at follow up.

## 2024-04-17 NOTE — Assessment & Plan Note (Signed)
 Patient stable.  Well controlled with current therapy.   Continue current meds.  PDMP check completed.

## 2024-04-17 NOTE — Assessment & Plan Note (Signed)
 Checking labs today. Will call pt. With results  Continue current diabetes POC, as patient has been well controlled on current regimen.  Will adjust meds if needed based on labs.   -CBC w/Diff -CMP w/eGFR -Hemoglobin A1C
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
## 2024-04-17 NOTE — Assessment & Plan Note (Signed)
 Patient stable.  Well controlled with current therapy.   Continue current meds.

## 2024-04-18 DIAGNOSIS — M7122 Synovial cyst of popliteal space [Baker], left knee: Secondary | ICD-10-CM | POA: Diagnosis not present

## 2024-04-18 DIAGNOSIS — M62562 Muscle wasting and atrophy, not elsewhere classified, left lower leg: Secondary | ICD-10-CM | POA: Diagnosis not present

## 2024-04-18 DIAGNOSIS — M629 Disorder of muscle, unspecified: Secondary | ICD-10-CM | POA: Diagnosis not present

## 2024-04-18 DIAGNOSIS — M79662 Pain in left lower leg: Secondary | ICD-10-CM | POA: Diagnosis not present

## 2024-04-20 ENCOUNTER — Other Ambulatory Visit: Payer: Self-pay | Admitting: Family

## 2024-04-20 DIAGNOSIS — M79662 Pain in left lower leg: Secondary | ICD-10-CM | POA: Diagnosis not present

## 2024-04-20 DIAGNOSIS — S8012XD Contusion of left lower leg, subsequent encounter: Secondary | ICD-10-CM | POA: Diagnosis not present

## 2024-04-24 DIAGNOSIS — M79662 Pain in left lower leg: Secondary | ICD-10-CM | POA: Diagnosis not present

## 2024-04-26 ENCOUNTER — Other Ambulatory Visit: Payer: Self-pay | Admitting: Family

## 2024-04-27 DIAGNOSIS — M25372 Other instability, left ankle: Secondary | ICD-10-CM | POA: Diagnosis not present

## 2024-04-27 DIAGNOSIS — M25371 Other instability, right ankle: Secondary | ICD-10-CM | POA: Diagnosis not present

## 2024-04-27 DIAGNOSIS — M2142 Flat foot [pes planus] (acquired), left foot: Secondary | ICD-10-CM | POA: Diagnosis not present

## 2024-04-27 DIAGNOSIS — M19072 Primary osteoarthritis, left ankle and foot: Secondary | ICD-10-CM | POA: Diagnosis not present

## 2024-04-27 DIAGNOSIS — M2141 Flat foot [pes planus] (acquired), right foot: Secondary | ICD-10-CM | POA: Diagnosis not present

## 2024-05-02 DIAGNOSIS — M79662 Pain in left lower leg: Secondary | ICD-10-CM | POA: Diagnosis not present

## 2024-05-03 ENCOUNTER — Other Ambulatory Visit: Payer: Self-pay | Admitting: Family

## 2024-05-03 DIAGNOSIS — G4700 Insomnia, unspecified: Secondary | ICD-10-CM

## 2024-05-04 DIAGNOSIS — M1712 Unilateral primary osteoarthritis, left knee: Secondary | ICD-10-CM | POA: Diagnosis not present

## 2024-05-04 DIAGNOSIS — Z96662 Presence of left artificial ankle joint: Secondary | ICD-10-CM | POA: Diagnosis not present

## 2024-05-08 NOTE — Telephone Encounter (Signed)
 Pt LM at 10:37a asking for this to be refilled

## 2024-05-10 ENCOUNTER — Other Ambulatory Visit: Payer: Self-pay | Admitting: Family

## 2024-05-10 ENCOUNTER — Ambulatory Visit: Admitting: Family Medicine

## 2024-05-10 DIAGNOSIS — G4700 Insomnia, unspecified: Secondary | ICD-10-CM

## 2024-05-16 DIAGNOSIS — M5416 Radiculopathy, lumbar region: Secondary | ICD-10-CM | POA: Diagnosis not present

## 2024-05-18 DIAGNOSIS — M7121 Synovial cyst of popliteal space [Baker], right knee: Secondary | ICD-10-CM | POA: Diagnosis not present

## 2024-05-18 DIAGNOSIS — M1712 Unilateral primary osteoarthritis, left knee: Secondary | ICD-10-CM | POA: Diagnosis not present

## 2024-05-20 ENCOUNTER — Other Ambulatory Visit: Payer: Self-pay | Admitting: Family

## 2024-05-21 DIAGNOSIS — M1712 Unilateral primary osteoarthritis, left knee: Secondary | ICD-10-CM | POA: Diagnosis not present

## 2024-05-30 DIAGNOSIS — M1712 Unilateral primary osteoarthritis, left knee: Secondary | ICD-10-CM | POA: Diagnosis not present

## 2024-06-06 ENCOUNTER — Encounter: Payer: Self-pay | Admitting: Cardiology

## 2024-06-06 ENCOUNTER — Ambulatory Visit (INDEPENDENT_AMBULATORY_CARE_PROVIDER_SITE_OTHER): Admitting: Cardiology

## 2024-06-06 VITALS — BP 112/60 | HR 75 | Ht 63.0 in | Wt 119.8 lb

## 2024-06-06 DIAGNOSIS — E1165 Type 2 diabetes mellitus with hyperglycemia: Secondary | ICD-10-CM

## 2024-06-06 DIAGNOSIS — I10 Essential (primary) hypertension: Secondary | ICD-10-CM

## 2024-06-06 DIAGNOSIS — J0110 Acute frontal sinusitis, unspecified: Secondary | ICD-10-CM | POA: Insufficient documentation

## 2024-06-06 MED ORDER — FLUTICASONE PROPIONATE 50 MCG/ACT NA SUSP: 1.0000 | Freq: Every day | NASAL | 2 refills | Status: AC

## 2024-06-06 MED ORDER — AMOXICILLIN-POT CLAVULANATE 875-125 MG PO TABS: 1.0000 | ORAL_TABLET | Freq: Two times a day (BID) | ORAL | 0 refills | Status: AC
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
## 2024-06-14 ENCOUNTER — Other Ambulatory Visit: Payer: Self-pay | Admitting: Family

## 2024-06-14 DIAGNOSIS — G8929 Other chronic pain: Secondary | ICD-10-CM

## 2024-06-21 ENCOUNTER — Other Ambulatory Visit: Payer: Self-pay | Admitting: Family

## 2024-06-26 ENCOUNTER — Other Ambulatory Visit: Payer: Self-pay

## 2024-06-26 DIAGNOSIS — B353 Tinea pedis: Secondary | ICD-10-CM | POA: Diagnosis not present

## 2024-06-26 DIAGNOSIS — M79672 Pain in left foot: Secondary | ICD-10-CM | POA: Diagnosis not present

## 2024-06-26 DIAGNOSIS — G8929 Other chronic pain: Secondary | ICD-10-CM

## 2024-06-27 ENCOUNTER — Other Ambulatory Visit: Payer: Self-pay

## 2024-06-27 MED ORDER — BUTALBITAL-ASPIRIN-CAFFEINE 50-325-40 MG PO CAPS: ORAL_CAPSULE | ORAL | 1 refills | Status: AC

## 2024-06-28 ENCOUNTER — Other Ambulatory Visit: Payer: Self-pay | Admitting: Family

## 2024-07-12 ENCOUNTER — Other Ambulatory Visit

## 2024-07-12 DIAGNOSIS — E1165 Type 2 diabetes mellitus with hyperglycemia: Secondary | ICD-10-CM

## 2024-07-12 DIAGNOSIS — E7800 Pure hypercholesterolemia, unspecified: Secondary | ICD-10-CM | POA: Diagnosis not present

## 2024-07-12 DIAGNOSIS — I10 Essential (primary) hypertension: Secondary | ICD-10-CM

## 2024-07-13 ENCOUNTER — Ambulatory Visit: Payer: Self-pay

## 2024-07-13 LAB — LIPID PANEL
Chol/HDL Ratio: 3.4 ratio (ref 0.0–5.0)
Cholesterol, Total: 171 mg/dL (ref 100–199)
HDL: 51 mg/dL (ref >39)
LDL Chol Calc (NIH): 100 mg/dL — ABNORMAL HIGH (ref 0–99)
Triglycerides: 112 mg/dL (ref 0–149)
VLDL Cholesterol Cal: 20 mg/dL (ref 5–40)

## 2024-07-13 LAB — CMP14+EGFR
ALT: 21 IU/L (ref 0–44)
AST: 14 IU/L (ref 0–40)
Albumin: 4.3 g/dL (ref 3.7–4.7)
Alkaline Phosphatase: 93 IU/L (ref 48–129)
BUN/Creatinine Ratio: 29 — ABNORMAL HIGH (ref 10–24)
BUN: 20 mg/dL (ref 8–27)
Bilirubin Total: 0.4 mg/dL (ref 0.0–1.2)
CO2: 26 mmol/L (ref 20–29)
Calcium: 9.7 mg/dL (ref 8.6–10.2)
Chloride: 100 mmol/L (ref 96–106)
Creatinine, Ser: 0.68 mg/dL — ABNORMAL LOW (ref 0.76–1.27)
Globulin, Total: 2.1 g/dL (ref 1.5–4.5)
Glucose: 138 mg/dL — ABNORMAL HIGH (ref 70–99)
Potassium: 4 mmol/L (ref 3.5–5.2)
Sodium: 140 mmol/L (ref 134–144)
Total Protein: 6.4 g/dL (ref 6.0–8.5)
eGFR: 92 mL/min/1.73 (ref >59)

## 2024-07-13 LAB — HEMOGLOBIN A1C
Est. average glucose Bld gHb Est-mCnc: 157 mg/dL
Hgb A1c MFr Bld: 7.1 % — ABNORMAL HIGH (ref 4.8–5.6)

## 2024-07-13 LAB — TSH: TSH: 1.76 u[IU]/mL (ref 0.450–4.500)

## 2024-07-17 ENCOUNTER — Encounter: Payer: Self-pay | Admitting: Family

## 2024-07-17 ENCOUNTER — Ambulatory Visit: Admitting: Family

## 2024-07-17 VITALS — BP 112/70 | HR 72 | Ht 63.0 in | Wt 123.0 lb

## 2024-07-17 DIAGNOSIS — I10 Essential (primary) hypertension: Secondary | ICD-10-CM | POA: Diagnosis not present

## 2024-07-17 DIAGNOSIS — E1165 Type 2 diabetes mellitus with hyperglycemia: Secondary | ICD-10-CM

## 2024-07-17 DIAGNOSIS — Z01812 Encounter for preprocedural laboratory examination: Secondary | ICD-10-CM

## 2024-07-17 DIAGNOSIS — E7800 Pure hypercholesterolemia, unspecified: Secondary | ICD-10-CM | POA: Diagnosis not present

## 2024-07-18 ENCOUNTER — Encounter: Payer: Self-pay | Admitting: Family

## 2024-07-18 LAB — VON WILLEBRAND PANEL
Factor VIII Activity: 93 % (ref 56–140)
Von Willebrand Ag: 60 % (ref 50–200)
Von Willebrand Factor: 71 % (ref 50–200)

## 2024-07-18 LAB — COAG STUDIES INTERP REPORT

## 2024-07-18 NOTE — Assessment & Plan Note (Signed)
 Blood pressure well controlled with current medications.  Continue current therapy.  Will reassess at follow up.

## 2024-07-18 NOTE — Assessment & Plan Note (Signed)
 Continue current diabetes POC, as patient has been well controlled on current regimen.  Will adjust meds if needed based on labs.
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
## 2024-07-18 NOTE — Assessment & Plan Note (Signed)
 Continue current therapy for lipid control. Will modify as needed based on labwork results.

## 2024-07-25 ENCOUNTER — Ambulatory Visit: Payer: Self-pay

## 2024-08-29 ENCOUNTER — Encounter: Payer: Self-pay | Admitting: Family

## 2024-08-30 ENCOUNTER — Other Ambulatory Visit: Payer: Self-pay

## 2024-08-30 MED ORDER — FLUCONAZOLE 150 MG PO TABS: 150.0000 mg | ORAL_TABLET | Freq: Every day | ORAL | 0 refills | Status: AC

## 2024-08-31 ENCOUNTER — Other Ambulatory Visit: Payer: Self-pay

## 2025-01-15 ENCOUNTER — Ambulatory Visit: Admitting: Family
